# Patient Record
Sex: Female | Born: 1995 | Race: White | Hispanic: No | Marital: Single | Smoking: Never smoker
Health system: Southern US, Community
[De-identification: ages and names within clinical notes are randomized; demographics above are authoritative.]

## PROBLEM LIST (undated history)

## (undated) DIAGNOSIS — T7840XA Allergy, unspecified, initial encounter: Secondary | ICD-10-CM

## (undated) HISTORY — PX: FOOT SURGERY: SHX648

## (undated) HISTORY — DX: Allergy, unspecified, initial encounter: T78.40XA

## (undated) HISTORY — PX: WISDOM TOOTH EXTRACTION: SHX21

---
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
## 2015-06-14 ENCOUNTER — Emergency Department (INDEPENDENT_AMBULATORY_CARE_PROVIDER_SITE_OTHER)
Admission: EM | Admit: 2015-06-14 | Discharge: 2015-06-14 | Disposition: A | Discharge status: Home / Self Care | Payer: BLUE CROSS/BLUE SHIELD | Source: Home / Self Care | Attending: Emergency Medicine | Admitting: Emergency Medicine

## 2015-06-14 ENCOUNTER — Encounter (HOSPITAL_COMMUNITY): Payer: Self-pay | Admitting: Emergency Medicine

## 2015-06-14 DIAGNOSIS — J111 Influenza due to unidentified influenza virus with other respiratory manifestations: Secondary | ICD-10-CM

## 2015-06-14 NOTE — Discharge Instructions (Signed)
Influenza, Adult Influenza (flu) is an infection in the mouth, nose, and throat (respiratory tract) caused by a virus. The flu can make you feel very ill. Influenza spreads easily from person to person (contagious).  HOME CARE   Only take medicines as told by your doctor.  Use a cool mist humidifier to make breathing easier.  Get plenty of rest until your fever goes away. This usually takes 3 to 4 days.  Drink enough fluids to keep your pee (urine) clear or pale yellow.  Cover your mouth and nose when you cough or sneeze.  Wash your hands well to avoid spreading the flu.  Stay home from work or school until your fever has been gone for at least 1 full day.  Get a flu shot every year. GET HELP RIGHT AWAY IF:   You have trouble breathing or feel short of breath.  Your skin or nails turn blue.  You have severe neck pain or stiffness.  You have a severe headache, facial pain, or earache.  Your fever gets worse or keeps coming back.  You feel sick to your stomach (nauseous), throw up (vomit), or have watery poop (diarrhea).  You have chest pain.  You have a deep cough that gets worse, or you cough up more thick spit (mucus). MAKE SURE YOU:   Understand these instructions.  Will watch your condition.  Will get help right away if you are not doing well or get worse.   This information is not intended to replace advice given to you by your health care provider. Make sure you discuss any questions you have with your health care provider.   Document Released: 02/09/2008 Document Revised: 05/23/2014 Document Reviewed: 08/01/2011 Elsevier Interactive Patient Education 2016 [employer]  

## 2015-06-14 NOTE — ED Notes (Signed)
C/o cold sx onset x7 days associated w/fevers, BA, chills, HA, runny nose, congestion, prod cough Reports she saw PCP on Thursday... Was Rx Bactrim and Ventolin w/no relief A&O x4... No acute distress.

## 2015-06-14 NOTE — ED Provider Notes (Signed)
CSN: [id number]     Arrival date & time 06/14/15  1314 History   First MD Initiated Contact with Patient 06/14/15 1447     Chief Complaint  Patient presents with  . URI   (Consider location/radiation/quality/duration/timing/severity/associated sxs/prior Treatment) HPI URI symptoms for the past several days.  Thinks she may have the flu. Body aches, fever, congestion. Others at work have been ill. 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 Past Medical History  Diagnosis Date  . Allergy    History reviewed. No pertinent past surgical history. Family History  Problem Relation Age of Onset  . Hypertension Father   . Diabetes Sister    Social History  Substance Use Topics  . Smoking status: Never Smoker   . Smokeless tobacco: None  . Alcohol Use: No   OB History    No data available     Review of Systems ROS +'ve body aches, fever, sore throat  Denies: HEADACHE, NAUSEA, ABDOMINAL PAIN, CHEST PAIN, CONGESTION, DYSURIA, SHORTNESS OF BREATH  Allergies  Review of patient's allergies indicates no known allergies.  Home Medications   Prior to Admission medications   Medication Sig Start Date End Date Taking? Authorizing Provider  albuterol (PROVENTIL HFA;VENTOLIN HFA) 108 (90 Base) MCG/ACT inhaler Inhale into the lungs every 6 (six) hours as needed for wheezing or shortness of breath.   Yes Historical Provider, MD  sulfamethoxazole-trimethoprim (BACTRIM DS,SEPTRA DS) 800-160 MG tablet Take 1 tablet by mouth 2 (two) times daily.   Yes Historical Provider, MD  benzonatate (TESSALON) 100 MG capsule Take 2 capsules (200 mg total) by mouth 2 (two) times daily as needed for cough. 09/15/12   Thao P Le, DO  cetirizine (ZYRTEC) 10 MG tablet Take 10 mg by mouth daily.    Historical Provider, MD  fluticasone (FLONASE) 50 MCG/ACT nasal spray Place 2 sprays into the nose daily. 09/15/12   Thao P Le, DO   Meds Ordered and Administered this Visit  Medications - No data to display  BP 110/74  mmHg  Pulse 83  Temp(Src) 98.2 F (36.8 C) (Oral)  SpO2 98%  LMP 06/04/2015 No data found.   Physical Exam  Constitutional: She is oriented to person, place, and time. She appears well-developed and well-nourished.  HENT:  Head: Normocephalic and atraumatic.  Right Ear: External ear normal.  Left Ear: External ear normal.  Nose: Nose normal.  Mouth/Throat: No oropharyngeal exudate.  Eyes: Conjunctivae are normal.  Neck: Normal range of motion. Neck supple.  Pulmonary/Chest: Effort normal and breath sounds normal.  Abdominal: Soft.  Musculoskeletal: Normal range of motion.  Neurological: She is alert and oriented to person, place, and time.  Skin: Skin is warm and dry.  Psychiatric: She has a normal mood and affect. Her behavior is normal. Judgment and thought content normal.  Nursing note and vitals reviewed.   ED Course  Procedures (including critical care time)  Labs Review Labs Reviewed - No data to display  Imaging Review No results found.   Visual Acuity Review  Right Eye Distance:   Left Eye Distance:   Bilateral Distance:    Right Eye Near:   Left Eye Near:    Bilateral Near:         MDM   1. Flu syndrome    Continue symptomatic care at home. Diet and activity as tolerated. Return to work note provided. Instructions of care provided discharged home in stable condition.    Tharon Aquas, PA 06/14/15 1718

## 2015-09-14 DIAGNOSIS — M21611 Bunion of right foot: Secondary | ICD-10-CM | POA: Diagnosis not present

## 2015-09-14 DIAGNOSIS — Z4789 Encounter for other orthopedic aftercare: Secondary | ICD-10-CM | POA: Diagnosis not present

## 2015-12-03 DIAGNOSIS — J029 Acute pharyngitis, unspecified: Secondary | ICD-10-CM | POA: Diagnosis not present

## 2016-03-25 DIAGNOSIS — Z6829 Body mass index (BMI) 29.0-29.9, adult: Secondary | ICD-10-CM | POA: Diagnosis not present

## 2016-03-25 DIAGNOSIS — Z01419 Encounter for gynecological examination (general) (routine) without abnormal findings: Secondary | ICD-10-CM | POA: Diagnosis not present

## 2016-03-25 DIAGNOSIS — Z130 Encounter for screening for diseases of the blood and blood-forming organs and certain disorders involving the immune mechanism: Secondary | ICD-10-CM | POA: Diagnosis not present

## 2016-03-25 DIAGNOSIS — Z1389 Encounter for screening for other disorder: Secondary | ICD-10-CM | POA: Diagnosis not present

## 2016-04-12 DIAGNOSIS — S335XXA Sprain of ligaments of lumbar spine, initial encounter: Secondary | ICD-10-CM | POA: Diagnosis not present

## 2016-04-12 DIAGNOSIS — S161XXA Strain of muscle, fascia and tendon at neck level, initial encounter: Secondary | ICD-10-CM | POA: Diagnosis not present

## 2016-04-12 DIAGNOSIS — S29012A Strain of muscle and tendon of back wall of thorax, initial encounter: Secondary | ICD-10-CM | POA: Diagnosis not present

## 2016-04-12 DIAGNOSIS — S233XXA Sprain of ligaments of thoracic spine, initial encounter: Secondary | ICD-10-CM | POA: Diagnosis not present

## 2016-04-21 DIAGNOSIS — M5441 Lumbago with sciatica, right side: Secondary | ICD-10-CM | POA: Diagnosis not present

## 2016-05-02 DIAGNOSIS — S233XXA Sprain of ligaments of thoracic spine, initial encounter: Secondary | ICD-10-CM | POA: Diagnosis not present

## 2016-05-02 DIAGNOSIS — S161XXA Strain of muscle, fascia and tendon at neck level, initial encounter: Secondary | ICD-10-CM | POA: Diagnosis not present

## 2016-05-02 DIAGNOSIS — S29012A Strain of muscle and tendon of back wall of thorax, initial encounter: Secondary | ICD-10-CM | POA: Diagnosis not present

## 2016-05-02 DIAGNOSIS — M5441 Lumbago with sciatica, right side: Secondary | ICD-10-CM | POA: Diagnosis not present

## 2016-05-13 DIAGNOSIS — M5441 Lumbago with sciatica, right side: Secondary | ICD-10-CM | POA: Diagnosis not present

## 2016-05-17 DIAGNOSIS — M5441 Lumbago with sciatica, right side: Secondary | ICD-10-CM | POA: Diagnosis not present

## 2016-05-26 DIAGNOSIS — M5441 Lumbago with sciatica, right side: Secondary | ICD-10-CM | POA: Diagnosis not present

## 2016-05-31 DIAGNOSIS — M5441 Lumbago with sciatica, right side: Secondary | ICD-10-CM | POA: Diagnosis not present

## 2016-06-07 DIAGNOSIS — S335XXD Sprain of ligaments of lumbar spine, subsequent encounter: Secondary | ICD-10-CM | POA: Diagnosis not present

## 2016-06-07 DIAGNOSIS — M5441 Lumbago with sciatica, right side: Secondary | ICD-10-CM | POA: Diagnosis not present

## 2016-08-31 DIAGNOSIS — J029 Acute pharyngitis, unspecified: Secondary | ICD-10-CM | POA: Diagnosis not present

## 2017-02-22 DIAGNOSIS — M79643 Pain in unspecified hand: Secondary | ICD-10-CM | POA: Diagnosis not present

## 2017-02-22 DIAGNOSIS — R635 Abnormal weight gain: Secondary | ICD-10-CM | POA: Diagnosis not present

## 2017-02-22 LAB — BASIC METABOLIC PANEL
BUN: 10 (ref 4–21)
CREATININE: 0.7 (ref <=1.1)
GLUCOSE: 81
POTASSIUM: 3.9 (ref 3.4–5.3)
SODIUM: 139 (ref 137–147)

## 2017-02-22 LAB — HEPATIC FUNCTION PANEL
ALT: 15 (ref 7–35)
AST: 15 (ref 13–35)
Alkaline Phosphatase: 69 (ref 25–125)
Bilirubin, Total: 0.2

## 2017-02-22 LAB — TSH: TSH: 3.16 (ref <=5.90)

## 2017-05-01 NOTE — Progress Notes (Signed)
Subjective:    Patient ID: Kelsey Ayers, female    DOB: 10-02-1995, 21 y.o.   MRN: [id number]  HPI:  Kelsey Ayers is here to establish as a new pt. She is a pleasant 21 year old female.  PMH: Chronic seasonal allergies.  She has had productive cough (thick/yellow mucus) for the last 3 weeks that initially improved then worsened about 5-6 days ago.  She has hx of seasonal allergies and she has been using OTC Zyrtec and Claritin with only minimal sx relief. She denies fever/night sweats/N/V/D.  She has had some clear nasal on/off the last month as well. She reports drinking 40-50 pz water/day and denies regular exercise and eats "whatever is easiest". She denies tobacco/excessive ETOH use.  Patient Care Team    Relationship Specialty Notifications Start End  Julaine Fusianford, Katy D, NP PCP - General Family Medicine  05/04/17   [facility]   Obstetrics and Gynecology  05/04/17   Alena BillsLittle, Edgar, MD Consulting Physician Pediatrics  05/04/17   Milus Heightedmon, Noelle, PA-C Physician Assistant Nurse Practitioner  05/04/17 05/04/17    Patient Active Problem List   Diagnosis Date Noted  . Healthcare maintenance 05/04/2017  . Seasonal allergies 05/04/2017  . Cough 05/04/2017     Past Medical History:  Diagnosis Date  . Allergy      Past Surgical History:  Procedure Laterality Date  . FOOT SURGERY Right   . WISDOM TOOTH EXTRACTION       Family History  Problem Relation Age of Onset  . Hypertension Father   . Diabetes Sister      Social History   Substance and Sexual Activity  Drug Use No     Social History   Substance and Sexual Activity  Alcohol Use Yes  . Alcohol/week: 0.6 oz  . Types: 1 Glasses of wine per week     Social History   Tobacco Use  Smoking Status Never Smoker  Smokeless Tobacco Never Used     Outpatient Encounter Medications as of 05/04/2017  Medication Sig  . azithromycin (ZITHROMAX) 250 MG tablet 2 tabs by mouth day one, 1 tab by mouth days 2-  5  . chlorpheniramine-HYDROcodone (TUSSIONEX PENNKINETIC ER) 10-8 MG/5ML SUER Take 5 mLs by mouth at bedtime as needed for cough.  . montelukast (SINGULAIR) 10 MG tablet Take 1 tablet (10 mg total) by mouth at bedtime.  . [DISCONTINUED] albuterol (PROVENTIL HFA;VENTOLIN HFA) 108 (90 Base) MCG/ACT inhaler Inhale into the lungs every 6 (six) hours as needed for wheezing or shortness of breath.  . [DISCONTINUED] benzonatate (TESSALON) 100 MG capsule Take 2 capsules (200 mg total) by mouth 2 (two) times daily as needed for cough.  . [DISCONTINUED] cetirizine (ZYRTEC) 10 MG tablet Take 10 mg by mouth daily.  . [DISCONTINUED] fluticasone (FLONASE) 50 MCG/ACT nasal spray Place 2 sprays into the nose daily.  . [DISCONTINUED] sulfamethoxazole-trimethoprim (BACTRIM DS,SEPTRA DS) 800-160 MG tablet Take 1 tablet by mouth 2 (two) times daily.   No facility-administered encounter medications on file as of 05/04/2017.     Allergies: Other  Body mass index is 33.88 kg/m.  Blood pressure 118/76, pulse 80, height 5' 4.5" (1.638 m), weight 200 lb 8 oz (90.9 kg), last menstrual period 04/24/2017, SpO2 97 %.     Review of Systems  Constitutional: Positive for fatigue. Negative for activity change, appetite change, chills, diaphoresis, fever and unexpected weight change.  HENT: Positive for congestion and sinus pressure. Negative for sore throat.   Eyes: Negative for  visual disturbance.  Respiratory: Positive for cough. Negative for chest tightness, shortness of breath, wheezing and stridor.   Cardiovascular: Negative for chest pain and leg swelling.  Gastrointestinal: Negative for abdominal distention, abdominal pain, blood in stool, constipation, diarrhea, nausea and vomiting.  Endocrine: Negative for cold intolerance, heat intolerance, polydipsia, polyphagia and polyuria.  Genitourinary: Negative for difficulty urinating and flank pain.  Musculoskeletal: Negative for arthralgias, back pain, gait  problem, joint swelling, myalgias, neck pain and neck stiffness.  Skin: Negative for color change, pallor, rash and wound.  Neurological: Negative for dizziness and headaches.  Hematological: Bruises/bleeds easily.  Psychiatric/Behavioral: Positive for sleep disturbance. Negative for self-injury and suicidal ideas. The patient is not nervous/anxious.        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 No distress.  HENT:  Head: Normocephalic and atraumatic.  Right Ear: Hearing, tympanic membrane, external ear and ear canal normal. Tympanic membrane is not erythematous and not bulging. No decreased hearing is noted.  Left Ear: Hearing, tympanic membrane, external ear and ear canal normal. Tympanic membrane is not erythematous and not bulging. No decreased hearing is noted.  Eyes: Conjunctivae are normal. Pupils are equal, round, and reactive to light.  Neck: Normal range of motion. Neck supple.  Cardiovascular: Normal rate, regular rhythm, normal heart sounds and intact distal pulses.  No murmur heard. Pulmonary/Chest: Effort normal and breath sounds normal. No respiratory distress. She has no decreased breath sounds. She has no wheezes. She has no rhonchi. She has no rales. She exhibits no tenderness.  Constant cough noted during OV  Abdominal: Soft. Bowel sounds are normal. She exhibits no distension and no mass. There is no tenderness. There is no rebound and no guarding.  Lymphadenopathy:    She has no cervical adenopathy.  Skin: Skin is warm and dry. No rash noted. She is not diaphoretic. No erythema. No pallor.  Psychiatric: She has a normal mood and affect. Her behavior is normal. Judgment and thought content normal.  Nursing note and vitals reviewed.         Assessment & Plan:   1. Healthcare maintenance   2. Seasonal allergies   3. Cough     Seasonal allergies Increase water intake, strive for  at leas 100 ounces/day.   Follow Heart Healthy diet. Please stop OTC antihistamine and start once daily Singulair.   Healthcare maintenance Increase water intake, strive for at leas 100 ounces/day.   Follow Heart Healthy diet Increase regular exercise.  Recommend at least 30 minutes daily, 5 days per week of walking, jogging, biking, swimming, YouTube/Pinterest workout videos. Annual physical with fasting labs.  Cough [place] Controlled Substance Database reviewed-no contraindications noted. Please stop OTC antihistamine and start once daily Singulair. Please use Azithromycin an Tussionex as directed. If you still have symptoms after antibiotic completed, then please call clinic.    FOLLOW-UP:  Return in about 1 year (around 05/04/2018) for CPE, Fasting Labs.

## 2017-05-04 ENCOUNTER — Ambulatory Visit (INDEPENDENT_AMBULATORY_CARE_PROVIDER_SITE_OTHER): Payer: BLUE CROSS/BLUE SHIELD | Admitting: Adult Health

## 2017-05-04 ENCOUNTER — Other Ambulatory Visit: Payer: Self-pay | Admitting: Adult Health

## 2017-05-04 ENCOUNTER — Encounter: Payer: Self-pay | Admitting: Adult Health

## 2017-05-04 VITALS — BP 118/76 | HR 80 | Ht 64.5 in | Wt 200.5 lb

## 2017-05-04 DIAGNOSIS — J302 Other seasonal allergic rhinitis: Secondary | ICD-10-CM

## 2017-05-04 DIAGNOSIS — R05 Cough: Secondary | ICD-10-CM

## 2017-05-04 DIAGNOSIS — Z0000 Encounter for general adult medical examination without abnormal findings: Secondary | ICD-10-CM

## 2017-05-04 DIAGNOSIS — R059 Cough, unspecified: Secondary | ICD-10-CM

## 2017-05-04 MED ORDER — HYDROCOD POLST-CPM POLST ER 10-8 MG/5ML PO SUER: 5.0000 mL | Freq: Every evening | ORAL | 0 refills | Status: DC | PRN

## 2017-05-04 MED ORDER — MONTELUKAST SODIUM 10 MG PO TABS: 10.0000 mg | ORAL_TABLET | Freq: Every day | ORAL | 3 refills | Status: DC

## 2017-05-04 MED ORDER — AZITHROMYCIN 250 MG PO TABS: ORAL_TABLET | ORAL | 0 refills | Status: DC

## 2017-05-04 NOTE — Patient Instructions (Addendum)
Heart-Healthy Eating Plan Many factors influence your heart health, including eating and exercise habits. Heart (coronary) risk increases with abnormal blood fat (lipid) levels. Heart-healthy meal planning includes limiting unhealthy fats, increasing healthy fats, and making other small dietary changes. This includes maintaining a healthy body weight to help keep lipid levels within a normal range. What is my plan? Your health care provider recommends that you:  Get no more than ___25__% of the total calories in your daily diet from fat.  Limit your intake of saturated fat to less than ___5___% of your total calories each day.  Limit the amount of cholesterol in your diet to less than __300___ mg per day.  What types of fat should I choose?  Choose healthy fats more often. Choose monounsaturated and polyunsaturated fats, such as olive oil and canola oil, flaxseeds, walnuts, almonds, and seeds.  Eat more omega-3 fats. Good choices include salmon, mackerel, sardines, tuna, flaxseed oil, and ground flaxseeds. Aim to eat fish at least two times each week.  Limit saturated fats. Saturated fats are primarily found in animal products, such as meats, butter, and cream. Plant sources of saturated fats include palm oil, palm kernel oil, and coconut oil.  Avoid foods with partially hydrogenated oils in them. These contain trans fats. Examples of foods that contain trans fats are stick margarine, some tub margarines, cookies, crackers, and other baked goods. What general guidelines do I need to follow?  Check food labels carefully to identify foods with trans fats or high amounts of saturated fat.  Fill one half of your plate with vegetables and green salads. Eat 4-5 servings of vegetables per day. A serving of vegetables equals 1 cup of raw leafy vegetables,  cup of raw or cooked cut-up vegetables, or  cup of vegetable juice.  Fill one fourth of your plate with whole grains. Look for the word  "whole" as the first word in the ingredient list.  Fill one fourth of your plate with lean protein foods.  Eat 4-5 servings of fruit per day. A serving of fruit equals one medium whole fruit,  cup of dried fruit,  cup of fresh, frozen, or canned fruit, or  cup of 100% fruit juice.  Eat more foods that contain soluble fiber. Examples of foods that contain this type of fiber are apples, broccoli, carrots, beans, peas, and barley. Aim to get 20-30 g of fiber per day.  Eat more home-cooked food and less restaurant, buffet, and fast food.  Limit or avoid alcohol.  Limit foods that are high in starch and sugar.  Avoid fried foods.  Cook foods by using methods other than frying. Baking, boiling, grilling, and broiling are all great options. Other fat-reducing suggestions include: ? Removing the skin from poultry. ? Removing all visible fats from meats. ? Skimming the fat off of stews, soups, and gravies before serving them. ? Steaming vegetables in water or broth.  Lose weight if you are overweight. Losing just 5-10% of your initial body weight can help your overall health and prevent diseases such as diabetes and heart disease.  Increase your consumption of nuts, legumes, and seeds to 4-5 servings per week. One serving of dried beans or legumes equals  cup after being cooked, one serving of nuts equals 1 ounces, and one serving of seeds equals  ounce or 1 tablespoon.  You may need to monitor your salt (sodium) intake, especially if you have high blood pressure. Talk with your health care provider or dietitian to get  more information about reducing sodium. What foods can I eat? Grains  Breads, including French, white, pita, wheat, raisin, rye, oatmeal, and Italian. Tortillas that are neither fried nor made with lard or trans fat. Low-fat rolls, including hotdog and hamburger buns and English muffins. Biscuits. Muffins. Waffles. Pancakes. Light popcorn. Whole-grain cereals. Flatbread.  Melba toast. Pretzels. Breadsticks. Rusks. Low-fat snacks and crackers, including oyster, saltine, matzo, graham, animal, and rye. Rice and pasta, including brown rice and those that are made with whole wheat. Vegetables All vegetables. Fruits All fruits, but limit coconut. Meats and Other Protein Sources Lean, well-trimmed beef, veal, pork, and lamb. Chicken and turkey without skin. All fish and shellfish. Wild duck, rabbit, pheasant, and venison. Egg whites or low-cholesterol egg substitutes. Dried beans, peas, lentils, and tofu.Seeds and most nuts. Dairy Low-fat or nonfat cheeses, including ricotta, string, and mozzarella. Skim or 1% milk that is liquid, powdered, or evaporated. Buttermilk that is made with low-fat milk. Nonfat or low-fat yogurt. Beverages Mineral water. Diet carbonated beverages. Sweets and Desserts Sherbets and fruit ices. Honey, jam, marmalade, jelly, and syrups. Meringues and gelatins. Pure sugar candy, such as hard candy, jelly beans, gumdrops, mints, marshmallows, and small amounts of dark chocolate. Angel food cake. Eat all sweets and desserts in moderation. Fats and Oils Nonhydrogenated (trans-free) margarines. Vegetable oils, including soybean, sesame, sunflower, olive, peanut, safflower, corn, canola, and cottonseed. Salad dressings or mayonnaise that are made with a vegetable oil. Limit added fats and oils that you use for cooking, baking, salads, and as spreads. Other Cocoa powder. Coffee and tea. All seasonings and condiments. The items listed above may not be a complete list of recommended foods or beverages. Contact your dietitian for more options. What foods are not recommended? Grains Breads that are made with saturated or trans fats, oils, or whole milk. Croissants. Butter rolls. Cheese breads. Sweet rolls. Donuts. Buttered popcorn. Chow mein noodles. High-fat crackers, such as cheese or butter crackers. Meats and Other Protein Sources Fatty meats, such  as hotdogs, short ribs, sausage, spareribs, bacon, ribeye roast or steak, and mutton. High-fat deli meats, such as salami and bologna. Caviar. Domestic duck and goose. Organ meats, such as kidney, liver, sweetbreads, brains, gizzard, chitterlings, and heart. Dairy Cream, sour cream, cream cheese, and creamed cottage cheese. Whole milk cheeses, including blue (bleu), Monterey Jack, Brie, [place], American, Havarti, Swiss, cheddar, Camembert, and [place]. Whole or 2% milk that is liquid, evaporated, or condensed. Whole buttermilk. Cream sauce or high-fat cheese sauce. Yogurt that is made from whole milk. Beverages Regular sodas and drinks with added sugar. Sweets and Desserts Frosting. Pudding. Cookies. Cakes other than angel food cake. Candy that has milk chocolate or white chocolate, hydrogenated fat, butter, coconut, or unknown ingredients. Buttered syrups. Full-fat ice cream or ice cream drinks. Fats and Oils Gravy that has suet, meat fat, or shortening. Cocoa butter, hydrogenated oils, palm oil, coconut oil, palm kernel oil. These can often be found in baked products, candy, fried foods, nondairy creamers, and whipped toppings. Solid fats and shortenings, including bacon fat, salt pork, lard, and butter. Nondairy cream substitutes, such as coffee creamers and sour cream substitutes. Salad dressings that are made of unknown oils, cheese, or sour cream. The items listed above may not be a complete list of foods and beverages to avoid. Contact your dietitian for more information. This information is not intended to replace advice given to you by your health care provider. Make sure you discuss any questions you have with your health care   provider. Document Released: 02/09/2008 Document Revised: 11/20/2015 Document Reviewed: 10/24/2013 Elsevier Interactive Patient Education  2018 [employer]   Cough, Adult Coughing is a reflex that clears your throat and your airways. Coughing helps to heal and  protect your lungs. It is normal to cough occasionally, but a cough that happens with other symptoms or lasts a long time may be a sign of a condition that needs treatment. A cough may last only 2-3 weeks (acute), or it may last longer than 8 weeks (chronic). What are the causes? Coughing is commonly caused by:  Breathing in substances that irritate your lungs.  A viral or bacterial respiratory infection.  Allergies.  Asthma.  Postnasal drip.  Smoking.  Acid backing up from the stomach into the esophagus (gastroesophageal reflux).  Certain medicines.  Chronic lung problems, including COPD (or rarely, lung cancer).  Other medical conditions such as heart failure.  Follow these instructions at home: Pay attention to any changes in your symptoms. Take these actions to help with your discomfort:  Take medicines only as told by your health care provider. ? If you were prescribed an antibiotic medicine, take it as told by your health care provider. Do not stop taking the antibiotic even if you start to feel better. ? Talk with your health care provider before you take a cough suppressant medicine.  Drink enough fluid to keep your urine clear or pale yellow.  If the air is dry, use a cold steam vaporizer or humidifier in your bedroom or your home to help loosen secretions.  Avoid anything that causes you to cough at work or at home.  If your cough is worse at night, try sleeping in a semi-upright position.  Avoid cigarette smoke. If you smoke, quit smoking. If you need help quitting, ask your health care provider.  Avoid caffeine.  Avoid alcohol.  Rest as needed.  Contact a health care provider if:  You have new symptoms.  You cough up pus.  Your cough does not get better after 2-3 weeks, or your cough gets worse.  You cannot control your cough with suppressant medicines and you are losing sleep.  You develop pain that is getting worse or pain that is not controlled  with pain medicines.  You have a fever.  You have unexplained weight loss.  You have night sweats. Get help right away if:  You cough up blood.  You have difficulty breathing.  Your heartbeat is very fast. This information is not intended to replace advice given to you by your health care provider. Make sure you discuss any questions you have with your health care provider. Document Released: 10/29/2010 Document Revised: 10/08/2015 Document Reviewed: 07/09/2014 Elsevier Interactive Patient Education  2018 [employer]  Increase water intake, strive for at leas 100 ounces/day.   Follow Heart Healthy diet Increase regular exercise.  Recommend at least 30 minutes daily, 5 days per week of walking, jogging, biking, swimming, YouTube/Pinterest workout videos. Please stop OTC antihistamine and start once daily Singulair. Please use Azithromycin an Tussionex as directed. If you still have symptoms after antibiotic completed, then please call clinic. Annual physical with fasting labs. WELCOME TO THE PRACTICE!

## 2017-05-04 NOTE — Assessment & Plan Note (Signed)
Increase water intake, strive for at leas 100 ounces/day.   Follow Heart Healthy diet Increase regular exercise.  Recommend at least 30 minutes daily, 5 days per week of walking, jogging, biking, swimming, YouTube/Pinterest workout videos. Annual physical with fasting labs.

## 2017-05-04 NOTE — Assessment & Plan Note (Signed)
[place] Controlled Substance Database reviewed-no contraindications noted. Please stop OTC antihistamine and start once daily Singulair. Please use Azithromycin an Tussionex as directed. If you still have symptoms after antibiotic completed, then please call clinic.

## 2017-05-04 NOTE — Assessment & Plan Note (Signed)
Increase water intake, strive for at leas 100 ounces/day.   Follow Heart Healthy diet. Please stop OTC antihistamine and start once daily Singulair.

## 2017-06-13 ENCOUNTER — Ambulatory Visit (INDEPENDENT_AMBULATORY_CARE_PROVIDER_SITE_OTHER): Payer: BLUE CROSS/BLUE SHIELD | Admitting: Adult Health

## 2017-06-13 ENCOUNTER — Encounter: Payer: Self-pay | Admitting: Adult Health

## 2017-06-13 VITALS — BP 107/70 | HR 91 | Temp 98.9°F | Ht 64.5 in | Wt 200.6 lb

## 2017-06-13 DIAGNOSIS — J029 Acute pharyngitis, unspecified: Secondary | ICD-10-CM | POA: Diagnosis not present

## 2017-06-13 DIAGNOSIS — J0100 Acute maxillary sinusitis, unspecified: Secondary | ICD-10-CM

## 2017-06-13 LAB — POCT RAPID STREP A (OFFICE): RAPID STREP A SCREEN: NEGATIVE

## 2017-06-13 MED ORDER — AMOXICILLIN-POT CLAVULANATE 875-125 MG PO TABS: 1.0000 | ORAL_TABLET | Freq: Two times a day (BID) | ORAL | 0 refills | Status: DC

## 2017-06-13 MED ORDER — PREDNISONE 20 MG PO TABS: ORAL_TABLET | ORAL | 0 refills | Status: DC

## 2017-06-13 NOTE — Patient Instructions (Signed)
Otitis Media, Adult Otitis media occurs when there is inflammation and fluid in the middle ear. Your middle ear is a part of the ear that contains bones for hearing as well as air that helps send sounds to your brain. What are the causes? This condition is caused by a blockage in the eustachian tube. This tube drains fluid from the ear to the back of the nose (nasopharynx). A blockage in this tube can be caused by an object or by swelling (edema) in the tube. Problems that can cause a blockage include:  A cold or other upper respiratory infection.  Allergies.  An irritant, such as tobacco smoke.  Enlarged adenoids. The adenoids are areas of soft tissue located high in the back of the throat, behind the nose and the roof of the mouth.  A mass in the nasopharynx.  Damage to the ear caused by pressure changes (barotrauma). What are the signs or symptoms? Symptoms of this condition include:  Ear pain.  A fever.  Decreased hearing.  A headache.  Tiredness (lethargy).  Fluid leaking from the ear.  Ringing in the ear. How is this diagnosed? This condition is diagnosed with a physical exam. During the exam your health care provider will use an instrument called an otoscope to look into your ear and check for redness, swelling, and fluid. He or she will also ask about your symptoms. Your health care provider may also order tests, such as:  A test to check the movement of the eardrum (pneumatic otoscopy). This test is done by squeezing a small amount of air into the ear.  A test that changes air pressure in the middle ear to check how well the eardrum moves and whether the eustachian tube is working (tympanogram). How is this treated? This condition usually goes away on its own within 3-5 days. But if the condition is caused by a bacteria infection and does not go away own its own, or keeps coming back, your health care provider may:  Prescribe antibiotic medicines to treat the  infection.  Prescribe or recommend medicines to control pain. Follow these instructions at home:  Take over-the-counter and prescription medicines only as told by your health care provider.  If you were prescribed an antibiotic medicine, take it as told by your health care provider. Do not stop taking the antibiotic even if you start to feel better.  Keep all follow-up visits as told by your health care provider. This is important. Contact a health care provider if:  You have bleeding from your nose.  There is a lump on your neck.  You are not getting better in 5 days.  You feel worse instead of better. Get help right away if:  You have severe pain that is not controlled with medicine.  You have swelling, redness, or pain around your ear.  You have stiffness in your neck.  A part of your face is paralyzed.  The bone behind your ear (mastoid) is tender when you touch it.  You develop a severe headache. Summary  Otitis media is redness, soreness, and swelling of the middle ear.  This condition usually goes away on its own within 3-5 days.  If the problem does not go away in 3-5 days, your health care provider may prescribe or recommend medicines to treat your symptoms.  If you were prescribed an antibiotic medicine, take it as told by your health care provider. This information is not intended to replace advice given to you by your   to you by your health care provider. Make sure you discuss any questions you have with your health care provider. Document Released: 02/05/2004 Document Revised: 04/22/2016 Document Reviewed: 04/22/2016 Elsevier Interactive Patient Education  2018 [employer]   Pharyngitis Pharyngitis is redness, pain, and swelling (inflammation) of the throat (pharynx). It is a very common cause of sore throat. Pharyngitis can be caused by a bacteria, but it is usually caused by a virus. Most cases of pharyngitis get better on their own without treatment. What are the  causes? This condition may be caused by:  Infection by viruses (viral). Viral pharyngitis spreads from person to person (is contagious) through coughing, sneezing, and sharing of personal items or utensils such as cups, forks, spoons, and toothbrushes.  Infection by bacteria (bacterial). Bacterial pharyngitis may be spread by touching the nose or face after coming in contact with the bacteria, or through more intimate contact, such as kissing.  Allergies. Allergies can cause buildup of mucus in the throat (post-nasal drip), leading to inflammation and irritation. Allergies can also cause blocked nasal passages, forcing breathing through the mouth, which dries and irritates the throat.  What increases the risk? You are more likely to develop this condition if:  You are 115-24 years old.  You are exposed to crowded environments such as daycare, school, or dormitory living.  You live in a cold climate.  You have a weakened disease-fighting (immune) system.  What are the signs or symptoms? Symptoms of this condition vary by the cause (viral, bacterial, or allergies) and can include:  Sore throat.  Fatigue.  Low-grade fever.  Headache.  Joint pain and muscle aches.  Skin rashes.  Swollen glands in the throat (lymph nodes).  Plaque-like film on the throat or tonsils. This is often a symptom of bacterial pharyngitis.  Vomiting.  Stuffy nose (nasal congestion).  Cough.  Red, itchy eyes (conjunctivitis).  Loss of appetite.  How is this diagnosed? This condition is often diagnosed based on your medical history and a physical exam. Your health care provider will ask you questions about your illness and your symptoms. A swab of your throat may be done to check for bacteria (rapid strep test). Other lab tests may also be done, depending on the suspected cause, but these are rare. How is this treated? This condition usually gets better in 3-4 days without medicine. Bacterial  pharyngitis may be treated with antibiotic medicines. Follow these instructions at home:  Take over-the-counter and prescription medicines only as told by your health care provider. ? If you were prescribed an antibiotic medicine, take it as told by your health care provider. Do not stop taking the antibiotic even if you start to feel better. ? Do not give children aspirin because of the association with Reye syndrome.  Drink enough water and fluids to keep your urine clear or pale yellow.  Get a lot of rest.  Gargle with a salt-water mixture 3-4 times a day or as needed. To make a salt-water mixture, completely dissolve -1 tsp of salt in 1 cup of warm water.  If your health care provider approves, you may use throat lozenges or sprays to soothe your throat. Contact a health care provider if:  You have large, tender lumps in your neck.  You have a rash.  You cough up green, yellow-brown, or bloody spit. Get help right away if:  Your neck becomes stiff.  You drool or are unable to swallow liquids.  You cannot drink or take medicines without  vomiting.  You have severe pain that does not go away, even after you take medicine.  You have trouble breathing, and it is not caused by a stuffy nose.  You have new pain and swelling in your joints such as the knees, ankles, wrists, or elbows. Summary  Pharyngitis is redness, pain, and swelling (inflammation) of the throat (pharynx).  While pharyngitis can be caused by a bacteria, the most common causes are viral.  Most cases of pharyngitis get better on their own without treatment.  Bacterial pharyngitis is treated with antibiotic medicines. This information is not intended to replace advice given to you by your health care provider. Make sure you discuss any questions you have with your health care provider. Document Released: 05/02/2005 Document Revised: 06/07/2016 Document Reviewed: 06/07/2016 Elsevier Interactive Patient  Education  2018 [employer]   Increase fluids/rest/vit-c: 2,000mg /daily Prednisone taper and Augmentin as directed. Alternate OTC Acetaminophen and Ibuprofen as directed. Work excuse provided, okay to return 06/15/17 If symptoms persist after antibiotic completed, then please call clinic.

## 2017-06-13 NOTE — Progress Notes (Signed)
Subjective:    Patient ID: Kelsey Ayers, female    DOB: [birth year], 22 y.o.   MRN: [id number]  HPI:  Kelsey Ayers presents with productive cough (thick, green mucus), clear nasal drainage, L ear fullness, sinus pressure, and extreme fatigue that all started >1.5 weeks ago and have steadily worsening.  She has been pushing fluids and using OTC DayQuil and Tylenol Flu with only minor sx relief.  She denies CP/dyspnea/dizziness/palpitations/fever/night sweats/N/V/D She denies tobacco use  Patient Care Team    Relationship Specialty Notifications Start End  Kelsey Fusi, NP PCP - General Family Medicine  05/04/17   Kelsey Bills, MD Consulting Physician Pediatrics  05/04/17     Patient Active Problem List   Diagnosis Date Noted  . Sore throat 06/13/2017  . Acute maxillary sinusitis 06/13/2017  . Healthcare maintenance 05/04/2017  . Seasonal allergies 05/04/2017  . Cough 05/04/2017     Past Medical History:  Diagnosis Date  . Allergy      Past Surgical History:  Procedure Laterality Date  . FOOT SURGERY Right   . WISDOM TOOTH EXTRACTION       Family History  Problem Relation Age of Onset  . Hypertension Father   . Diabetes Sister      Social History   Substance and Sexual Activity  Drug Use No     Social History   Substance and Sexual Activity  Alcohol Use Yes  . Alcohol/week: 0.6 oz  . Types: 1 Glasses of wine per week     Social History   Tobacco Use  Smoking Status Never Smoker  Smokeless Tobacco Never Used     Outpatient Encounter Medications as of 06/13/2017  Medication Sig  . montelukast (SINGULAIR) 10 MG tablet Take 1 tablet (10 mg total) by mouth at bedtime.  Marland Kitchen amoxicillin-clavulanate (AUGMENTIN) 875-125 MG tablet Take 1 tablet by mouth 2 (two) times daily.  . predniSONE (DELTASONE) 20 MG tablet 1 tab every 12 hrs for first three days.  1 tab daily for 3 days  . [DISCONTINUED] azithromycin (ZITHROMAX) 250 MG tablet 2 tabs by mouth day one,  1 tab by mouth days 2- 5  . [DISCONTINUED] chlorpheniramine-HYDROcodone (TUSSIONEX PENNKINETIC ER) 10-8 MG/5ML SUER Take 5 mLs by mouth at bedtime as needed for cough.   No facility-administered encounter medications on file as of 06/13/2017.     Allergies: Other  Body mass index is 33.9 kg/m.  Blood pressure 107/70, pulse 91, temperature 98.9 F (37.2 C), temperature source Oral, height 5' 4.5" (1.638 m), weight 200 lb 9.6 oz (91 kg), last menstrual period 05/07/2017, SpO2 98 %.     Review of Systems  Constitutional: Positive for activity change and fatigue. Negative for appetite change, chills, diaphoresis, fever and unexpected weight change.  HENT: Positive for congestion, ear pain, postnasal drip, rhinorrhea, sinus pressure, sinus pain and sore throat. Negative for sneezing, trouble swallowing and voice change.   Eyes: Negative for visual disturbance.  Respiratory: Positive for cough. Negative for chest tightness, shortness of breath, wheezing and stridor.   Cardiovascular: Negative for chest pain, palpitations and leg swelling.  Gastrointestinal: Negative for abdominal distention, abdominal pain, blood in stool, constipation, diarrhea, nausea and vomiting.  Endocrine: Negative for cold intolerance, heat intolerance, polydipsia, polyphagia and polyuria.  Genitourinary: Negative for difficulty urinating, flank pain and hematuria.  Neurological: Negative for dizziness and headaches.  Psychiatric/Behavioral: Positive for sleep disturbance. Negative for decreased concentration, dysphoric mood, hallucinations, self-injury and suicidal ideas. The patient is not nervous/anxious  and is not hyperactive.        Objective:   Physical Exam  Constitutional: She is oriented to person, place, and time. She appears well-developed and well-nourished. No distress.  HENT:  Head: Normocephalic and atraumatic.  Right Ear: Hearing, external ear and ear canal normal. Tympanic membrane is  erythematous and bulging. No decreased hearing is noted.  Left Ear: Tympanic membrane is erythematous and bulging. No decreased hearing is noted.  Nose: Mucosal edema and rhinorrhea present. Right sinus exhibits maxillary sinus tenderness. Right sinus exhibits no frontal sinus tenderness. Left sinus exhibits maxillary sinus tenderness. Left sinus exhibits no frontal sinus tenderness.  Mouth/Throat: Uvula is midline and mucous membranes are normal. Posterior oropharyngeal edema and posterior oropharyngeal erythema present. No oropharyngeal exudate or tonsillar abscesses.  Eyes: Conjunctivae are normal. Pupils are equal, round, and reactive to light.  Neck: Normal range of motion. Neck supple.  Cardiovascular: Normal rate, regular rhythm, normal heart sounds and intact distal pulses.  No murmur heard. Pulmonary/Chest: Effort normal and breath sounds normal. No respiratory distress. She has no wheezes. She has no rales.  Lymphadenopathy:    She has no cervical adenopathy.  Neurological: She is alert and oriented to person, place, and time.  Skin: Skin is warm and dry. No rash noted. She is not diaphoretic. No erythema. There is pallor.  He baseline is quite pale in complexion  Psychiatric: She has a normal mood and affect. Her behavior is normal. Judgment and thought content normal.  Nursing note and vitals reviewed.         Assessment & Plan:   1. Sore throat   2. Acute maxillary sinusitis, recurrence not specified     Acute maxillary sinusitis  Increase fluids/rest/vit-c: 2,000mg /daily Prednisone taper and Augmentin as directed. Alternate OTC Acetaminophen and Ibuprofen as directed. Work excuse provided, okay to return 06/15/17 If symptoms persist after antibiotic completed, then please call clinic.    Sore throat Strep Test Neg    FOLLOW-UP:  Return if symptoms worsen or fail to improve.

## 2017-06-13 NOTE — Assessment & Plan Note (Signed)
  Increase fluids/rest/vit-c: 2,000mg /daily Prednisone taper and Augmentin as directed. Alternate OTC Acetaminophen and Ibuprofen as directed. Work excuse provided, okay to return 06/15/17 If symptoms persist after antibiotic completed, then please call clinic.

## 2017-06-13 NOTE — Assessment & Plan Note (Signed)
Strep Test Neg

## 2017-06-25 ENCOUNTER — Encounter: Payer: Self-pay | Admitting: Adult Health

## 2017-06-26 ENCOUNTER — Other Ambulatory Visit: Payer: Self-pay | Admitting: Adult Health

## 2017-06-26 MED ORDER — DOXYCYCLINE HYCLATE 100 MG PO TABS: 100.0000 mg | ORAL_TABLET | Freq: Two times a day (BID) | ORAL | 0 refills | Status: DC

## 2017-09-14 ENCOUNTER — Encounter: Payer: Self-pay | Admitting: Family Medicine

## 2017-09-14 ENCOUNTER — Ambulatory Visit (INDEPENDENT_AMBULATORY_CARE_PROVIDER_SITE_OTHER): Payer: BLUE CROSS/BLUE SHIELD | Admitting: Family Medicine

## 2017-09-14 ENCOUNTER — Encounter: Payer: Self-pay | Admitting: Adult Health

## 2017-09-14 VITALS — BP 132/83 | HR 110 | Temp 101.5°F | Ht 64.5 in | Wt 201.8 lb

## 2017-09-14 DIAGNOSIS — R110 Nausea: Secondary | ICD-10-CM

## 2017-09-14 DIAGNOSIS — J029 Acute pharyngitis, unspecified: Secondary | ICD-10-CM | POA: Diagnosis not present

## 2017-09-14 DIAGNOSIS — J020 Streptococcal pharyngitis: Secondary | ICD-10-CM | POA: Insufficient documentation

## 2017-09-14 DIAGNOSIS — R509 Fever, unspecified: Secondary | ICD-10-CM

## 2017-09-14 LAB — POCT RAPID STREP A (OFFICE): Rapid Strep A Screen: NEGATIVE

## 2017-09-14 MED ORDER — AMOXICILLIN 875 MG PO TABS: 875.0000 mg | ORAL_TABLET | Freq: Two times a day (BID) | ORAL | 0 refills | Status: DC

## 2017-09-14 NOTE — Patient Instructions (Signed)
If the symptoms cannot last for more than a week to 10 days, please return to the clinic as you would need to be tested for mononucleosis and additional blood work and evaluation at that time.  Otherwise please use Advil\Tylenol as needed for fever and pain.  Please take all of the antibiotics as prescribed for your condition.   Strep Throat Strep throat is a bacterial infection of the throat. Your health care provider may call the infection tonsillitis or pharyngitis, depending on whether there is swelling in the tonsils or at the back of the throat. Strep throat is most common during the cold months of the year in children who are 46-42 years of age, but it can happen during any season in people of any age. This infection is spread from person to person (contagious) through coughing, sneezing, or close contact. What are the causes? Strep throat is caused by the bacteria called Streptococcus pyogenes. What increases the risk? This condition is more likely to develop in:  People who spend time in crowded places where the infection can spread easily.  People who have close contact with someone who has strep throat.  What are the signs or symptoms? Symptoms of this condition include:  Fever or chills.  Redness, swelling, or pain in the tonsils or throat.  Pain or difficulty when swallowing.  White or yellow spots on the tonsils or throat.  Swollen, tender glands in the neck or under the jaw.  Red rash all over the body (rare).  How is this diagnosed? This condition is diagnosed by performing a rapid strep test or by taking a swab of your throat (throat culture test). Results from a rapid strep test are usually ready in a few minutes, but throat culture test results are available after one or two days. How is this treated? This condition is treated with antibiotic medicine. Follow these instructions at home: Medicines  Take over-the-counter and prescription medicines only as told by  your health care provider.  Take your antibiotic as told by your health care provider. Do not stop taking the antibiotic even if you start to feel better.  Have family members who also have a sore throat or fever tested for strep throat. They may need antibiotics if they have the strep infection. Eating and drinking  Do not share food, drinking cups, or personal items that could cause the infection to spread to other people.  If swallowing is difficult, try eating soft foods until your sore throat feels better.  Drink enough fluid to keep your urine clear or pale yellow. General instructions  Gargle with a salt-water mixture 3-4 times per day or as needed. To make a salt-water mixture, completely dissolve -1 tsp of salt in 1 cup of warm water.  Make sure that all household members wash their hands well.  Get plenty of rest.  Stay home from school or work until you have been taking antibiotics for 24 hours.  Keep all follow-up visits as told by your health care provider. This is important. Contact a health care provider if:  The glands in your neck continue to get bigger.  You develop a rash, cough, or earache.  You cough up a thick liquid that is green, yellow-brown, or bloody.  You have pain or discomfort that does not get better with medicine.  Your problems seem to be getting worse rather than better.  You have a fever. Get help right away if:  You have new symptoms, such as vomiting,  severe headache, stiff or painful neck, chest pain, or shortness of breath.  You have severe throat pain, drooling, or changes in your voice.  You have swelling of the neck, or the skin on the neck becomes red and tender.  You have signs of dehydration, such as fatigue, dry mouth, and decreased urination.  You become increasingly sleepy, or you cannot wake up completely.  Your joints become red or painful. This information is not intended to replace advice given to you by your health  care provider. Make sure you discuss any questions you have with your health care provider. Document Released: 04/29/2000 Document Revised: 12/30/2015 Document Reviewed: 08/25/2014 Elsevier Interactive Patient Education  [employer].

## 2017-09-14 NOTE — Progress Notes (Signed)
Acute Care Office visit  Assessment and plan:  1. Sore throat   2. Fever, unspecified fever cause   3. Nausea   4. Pharyngitis due to Streptococcus species     1. Sore Throat - Viral vs Allergic vs Bacterial causes for pt's symptoms reveiwed.     - Antibiotics prescribed today due to physical exam findings consistent with streptococcal pharyngitis as well as HPI.  Will treat with antibiotics and send out culture.  Did warn patient about mononucleosis and if symptoms last past 7 to 10 days she is to return to clinic for further labs including CBC, Monospot and abdominal exam etc..  - Supportive care and various OTC medications discussed in addition to any prescribed. - Advised patient to alternate tylenol and ibuprofen as recommended.  - Advised patient to drink plenty of water, more than normal, especially given her fever (101.5).  - Patient should not return to work until fever has resolved for over 24 hours.  - Liquid diet recommended while the patient's nausea continues.  2. Follow-Up - Call or RTC if new symptoms, or if no improvement or worse over next several days.      Meds ordered this encounter  Medications  . amoxicillin (AMOXIL) 875 MG tablet    Sig: Take 1 tablet (875 mg total) by mouth 2 (two) times daily.    Dispense:  20 tablet    Refill:  0    Orders Placed This Encounter  Procedures  . Culture, Group A Strep  . POCT rapid strep A    Gross side effects, risk and benefits, and alternatives of medications discussed with patient.  Patient is aware that all medications have potential side effects and we are unable to predict every sideeffect or drug-drug interaction that may occur.  Expresses verbal understanding and consents to current therapy plan and treatment regiment.   Education and routine counseling performed. Handouts provided.  Anticipatory guidance and routine counseling done re: condition, txmnt options and need for follow up. All  questions of patient's were answered.  Return if symptoms worsen or fail to improve.  Please see AVS handed out to patient at the end of our visit for additional patient instructions/ counseling done pertaining to today's office visit.  Note: This document was partially repared using Dragon voice recognition software and may include unintentional dictation errors.  This document serves as a record of services personally performed by Thomasene Lot, DO. It was created on her behalf by Peggye Fothergill, a trained medical scribe. The creation of this record is based on the scribe's personal observations and the provider's statements to them.   I have reviewed the above medical documentation for accuracy and completeness and I concur.  Thomasene Lot 09/14/17 5:05 PM    Subjective:    Chief Complaint  Patient presents with  . Sore Throat    with fever   Works as a Scientist, physiological in accounts payable, Technical brewer, [employer].  No sick contacts with children.  Has no sick children at home.  No known sick contacts recently with anyone feverish or ill recently.  HPI:  Pt presents with Sx for 2 days.  Woke up yesterday with her throat hurting on left side.  This morning she woke up at 4 AM, hot, and couldn't get comfortable.  Notes that she couldn't initially find her thermometer this morning, but felt feverish and sweating.   C/o:  Sore throat yesterday, fever today.  She feels nauseous and her throat is  still hurting.  Notes that her fever keeps going from 101 to 99 and back.  Feels a little achy, but notes that her back hurts all the time because she has bulging disk.  No severe aches out of her norms, but admits to feeling achier during her fever.  Notes that she gets fevers very rarely, maybe two per year.  Denies:  Headache, runny nose, cough, congestion.    For symptoms patient has tried:  Drinking water.  Took tylenol this morning to reduce her fever, but not since.  Overall getting:    Worsening today with fever onset this morning.    Patient Care Team    Relationship Specialty Notifications Start End  Crescent Bar, Orpha Bur D, NP PCP - General Family Medicine  05/04/17   Alena Bills, MD Consulting Physician Pediatrics  05/04/17     Past medical history, Surgical history, Family history reviewed and noted below, Social history, Allergies, and Medications have been entered into the medical record, reviewed and changed as needed.   Allergies  Allergen Reactions  . Other Itching    Cats, dogs    Review of Systems: - see above HPI for pertinent positives General:   No F/C, wt loss Pulm:   No DIB, pleuritic chest pain Card:  No CP, palpitations Abd:  No n/v/d or pain Ext:  No inc edema from baseline   Objective:   Blood pressure 132/83, pulse (!) 110, temperature (!) 101.5 F (38.6 C), height 5' 4.5" (1.638 m), weight 201 lb 12.8 oz (91.5 kg), SpO2 99 %. Body mass index is 34.1 kg/m. General: Well Developed, well nourished, appropriate for stated age.  Neuro: Alert and oriented x3, extra-ocular muscles intact, sensation grossly intact.  HEENT: Normocephalic, atraumatic, pupils equal round reactive to light, neck supple, no masses, tender lymphadenopathy, left greater than right, anterior cervical region.  TM's intact B/L, no acute findings. Nares- patent, clear d/c, OP- tonsils enlarged bilaterally with whitish-yellowish exudate on bilateral surfaces, with significant erythema, No TTP sinuses Skin: Warm and dry, no gross rash. Cardiac: RRR, S1 S2,  no murmurs rubs or gallops.  Respiratory: ECTA B/L and A/P, Not using accessory muscles, speaking in full sentences- unlabored. Vascular:  No gross lower ext edema, cap RF less 2 sec. Psych: No HI/SI, judgement and insight good, Euthymic mood. Full Affect.

## 2017-09-17 LAB — CULTURE, GROUP A STREP: Strep A Culture: NEGATIVE

## 2017-09-22 DIAGNOSIS — L2089 Other atopic dermatitis: Secondary | ICD-10-CM | POA: Diagnosis not present

## 2017-12-19 DIAGNOSIS — R102 Pelvic and perineal pain: Secondary | ICD-10-CM | POA: Diagnosis not present

## 2018-01-03 DIAGNOSIS — R102 Pelvic and perineal pain: Secondary | ICD-10-CM | POA: Diagnosis not present

## 2018-01-19 ENCOUNTER — Encounter: Payer: Self-pay | Admitting: Adult Health

## 2018-01-19 ENCOUNTER — Encounter: Payer: Self-pay | Admitting: Family Medicine

## 2018-01-22 ENCOUNTER — Other Ambulatory Visit: Payer: Self-pay | Admitting: Adult Health

## 2018-01-22 MED ORDER — ALBUTEROL SULFATE HFA 108 (90 BASE) MCG/ACT IN AERS: 2.0000 | INHALATION_SPRAY | Freq: Four times a day (QID) | RESPIRATORY_TRACT | 0 refills | Status: DC | PRN

## 2018-01-22 NOTE — Progress Notes (Unsigned)
P 

## 2018-01-23 ENCOUNTER — Encounter: Payer: Self-pay | Admitting: Adult Health

## 2018-01-23 ENCOUNTER — Ambulatory Visit (INDEPENDENT_AMBULATORY_CARE_PROVIDER_SITE_OTHER): Payer: BLUE CROSS/BLUE SHIELD | Admitting: Adult Health

## 2018-01-23 VITALS — BP 117/75 | HR 74 | Ht 64.5 in | Wt 197.4 lb

## 2018-01-23 DIAGNOSIS — R350 Frequency of micturition: Secondary | ICD-10-CM | POA: Diagnosis not present

## 2018-01-23 DIAGNOSIS — R109 Unspecified abdominal pain: Secondary | ICD-10-CM | POA: Diagnosis not present

## 2018-01-23 LAB — POCT URINALYSIS DIPSTICK
Bilirubin, UA: NEGATIVE
Blood, UA: NEGATIVE
Glucose, UA: NEGATIVE
KETONES UA: NEGATIVE
LEUKOCYTES UA: NEGATIVE
NITRITE UA: NEGATIVE
PH UA: 6 (ref 5.0–8.0)
PROTEIN UA: NEGATIVE
Urobilinogen, UA: 0.2 E.U./dL

## 2018-01-23 NOTE — Patient Instructions (Signed)
Abdominal Pain, Adult Many things can cause belly (abdominal) pain. Most times, belly pain is not dangerous. Many cases of belly pain can be watched and treated at home. Sometimes belly pain is serious, though. Your doctor will try to find the cause of your belly pain. Follow these instructions at home:  Take over-the-counter and prescription medicines only as told by your doctor. Do not take medicines that help you poop (laxatives) unless told to by your doctor.  Drink enough fluid to keep your pee (urine) clear or pale yellow.  Watch your belly pain for any changes.  Keep all follow-up visits as told by your doctor. This is important. Contact a doctor if:  Your belly pain changes or gets worse.  You are not hungry, or you lose weight without trying.  You are having trouble pooping (constipated) or have watery poop (diarrhea) for more than 2-3 days.  You have pain when you pee or poop.  Your belly pain wakes you up at night.  Your pain gets worse with meals, after eating, or with certain foods.  You are throwing up and cannot keep anything down.  You have a fever. Get help right away if:  Your pain does not go away as soon as your doctor says it should.  You cannot stop throwing up.  Your pain is only in areas of your belly, such as the right side or the left lower part of the belly.  You have bloody or black poop, or poop that looks like tar.  You have very bad pain, cramping, or bloating in your belly.  You have signs of not having enough fluid or water in your body (dehydration), such as: ? Dark pee, very little pee, or no pee. ? Cracked lips. ? Dry mouth. ? Sunken eyes. ? Sleepiness. ? Weakness. This information is not intended to replace advice given to you by your health care provider. Make sure you discuss any questions you have with your health care provider. Document Released: 10/19/2007 Document Revised: 11/20/2015 Document Reviewed: 10/14/2015 Elsevier  Interactive Patient Education  2018 [employer].  Urinalysis is normal. Order for abdominal ultrasound placed. We will call you when results are available. FEEL BETTER!

## 2018-01-23 NOTE — Assessment & Plan Note (Signed)
UA Neg [place] Abd Complete ordered Remain well hydrated, eat a healthy diet, and continue regular exercise.

## 2018-01-23 NOTE — Progress Notes (Signed)
Subjective:    Patient ID: Kelsey Ayers, female    DOB: May 14, 1996, 22 y.o.   MRN: [id number]  HPI :  [name] presents with RLQ and periumbilical pain that initially started 6 weeks ago. She was seen by her OB/GYN and UA and transvaginal US were both negative. The pain resolved afterwards, however developed again 5 days ago. Pain is constant, described as "pushing in pressure", and rated 7/10. She reports urinary frequency and mild nausea without vomiting associated with pain. She denies constipation/diarrhe/fever/night sweats. Eating or certain type of foods do not influence abdominal pain. She reports poor appetite the last few days. She denies acute trauma prior to onset of sx's. She has been increasing regular exercise, however denies heavy abdominal /core training regime.  Patient Care Team    Relationship Specialty Notifications Start End  William Hamburger D, NP PCP - General Family Medicine  05/04/17   Alena Bills, MD Consulting Physician Pediatrics  05/04/17     Patient Active Problem List   Diagnosis Date Noted  . Abdominal pain, acute 01/23/2018  . Pharyngitis due to Streptococcus species 09/14/2017  . Acute maxillary sinusitis 06/13/2017  . Healthcare maintenance 05/04/2017  . Seasonal allergies 05/04/2017  . Cough 05/04/2017     Past Medical History:  Diagnosis Date  . Allergy      Past Surgical History:  Procedure Laterality Date  . FOOT SURGERY Right   . WISDOM TOOTH EXTRACTION       Family History  Problem Relation Age of Onset  . Hypertension Father   . Diabetes Sister      Social History   Substance and Sexual Activity  Drug Use No     Social History   Substance and Sexual Activity  Alcohol Use Yes  . Alcohol/week: 1.0 standard drinks  . Types: 1 Glasses of wine per week     Social History   Tobacco Use  Smoking Status Never Smoker  Smokeless Tobacco Never Used     Outpatient Encounter Medications as of 01/23/2018   Medication Sig  . albuterol (PROVENTIL HFA;VENTOLIN HFA) 108 (90 Base) MCG/ACT inhaler Inhale 2 puffs into the lungs every 6 (six) hours as needed for wheezing or shortness of breath.  . montelukast (SINGULAIR) 10 MG tablet Take 1 tablet (10 mg total) by mouth at bedtime.  . [DISCONTINUED] amoxicillin (AMOXIL) 875 MG tablet Take 1 tablet (875 mg total) by mouth 2 (two) times daily.   No facility-administered encounter medications on file as of 01/23/2018.     Allergies: Other  Body mass index is 33.36 kg/m.  Blood pressure 117/75, pulse 74, height 5' 4.5" (1.638 m), weight 197 lb 6.4 oz (89.5 kg), SpO2 98 %.    Review of Systems  Constitutional: Positive for fatigue. Negative for activity change, appetite change, chills, diaphoresis, fever and unexpected weight change.  Respiratory: Negative for cough, chest tightness, shortness of breath, wheezing and stridor.   Cardiovascular: Negative for chest pain, palpitations and leg swelling.  Gastrointestinal: Positive for abdominal pain and nausea. Negative for abdominal distention, blood in stool, constipation, diarrhea and vomiting.  Genitourinary: Positive for frequency. Negative for difficulty urinating, dysuria and flank pain.  Neurological: Negative for headaches.  Hematological: Does not bruise/bleed easily.       Objective:   Physical Exam  Constitutional: She appears well-developed and well-nourished. No distress.  HENT:  Head: Normocephalic and atraumatic.  Right Ear: External ear normal.  Left Ear: External ear normal.  Nose: Nose normal.  Mouth/Throat: Oropharynx is clear and moist.  Eyes: Pupils are equal, round, and reactive to light. Conjunctivae and EOM are normal.  Cardiovascular: Normal rate, regular rhythm, normal heart sounds and intact distal pulses.  No murmur heard. Pulmonary/Chest: Effort normal and breath sounds normal. No stridor. No respiratory distress. She has no wheezes. She has no rales. She exhibits  no tenderness.  Abdominal: Soft. Normal appearance and bowel sounds are normal. She exhibits no mass. There is no hepatosplenomegaly. There is tenderness in the right lower quadrant and periumbilical area. There is no rigidity, no rebound, no guarding, no CVA tenderness, no tenderness at McBurney's point and negative Murphy's sign. No hernia.  Skin: Skin is warm and dry. Capillary refill takes less than 2 seconds. No rash noted. She is not diaphoretic. No erythema. No pallor.  Psychiatric: She has a normal mood and affect. Her behavior is normal. Judgment and thought content normal.  Nursing note and vitals reviewed.     Assessment & Plan:   1. Abdominal pain, acute   2. Urinary frequency     Abdominal pain, acute UA Neg [place] Abd Complete ordered Remain well hydrated, eat a healthy diet, and continue regular exercise.     FOLLOW-UP:  Return if symptoms worsen or fail to improve.

## 2018-01-24 ENCOUNTER — Encounter: Payer: Self-pay | Admitting: Adult Health

## 2018-01-25 ENCOUNTER — Encounter: Payer: Self-pay | Admitting: Adult Health

## 2018-02-01 ENCOUNTER — Other Ambulatory Visit: Payer: Self-pay | Admitting: Adult Health

## 2018-02-01 DIAGNOSIS — R109 Unspecified abdominal pain: Secondary | ICD-10-CM

## 2018-02-02 ENCOUNTER — Inpatient Hospital Stay: Admission: RE | Admit: 2018-02-02 | Payer: BLUE CROSS/BLUE SHIELD | Source: Ambulatory Visit

## 2018-02-08 ENCOUNTER — Ambulatory Visit
Admission: RE | Admit: 2018-02-08 | Discharge: 2018-02-08 | Disposition: A | Discharge status: Home / Self Care | Payer: BLUE CROSS/BLUE SHIELD | Source: Ambulatory Visit | Attending: Adult Health | Admitting: Adult Health

## 2018-02-08 DIAGNOSIS — R109 Unspecified abdominal pain: Secondary | ICD-10-CM

## 2018-02-08 DIAGNOSIS — R110 Nausea: Secondary | ICD-10-CM | POA: Diagnosis not present

## 2018-02-08 MED ORDER — IOPAMIDOL (ISOVUE-300) INJECTION 61%
100.0000 mL | Freq: Once | INTRAVENOUS | Status: AC | PRN
  Administered 2018-02-08: 100 mL via INTRAVENOUS

## 2018-02-13 ENCOUNTER — Other Ambulatory Visit: Payer: Self-pay

## 2018-02-13 DIAGNOSIS — R102 Pelvic and perineal pain: Secondary | ICD-10-CM

## 2018-02-13 DIAGNOSIS — R350 Frequency of micturition: Secondary | ICD-10-CM

## 2018-02-13 DIAGNOSIS — N3289 Other specified disorders of bladder: Secondary | ICD-10-CM

## 2018-03-15 DIAGNOSIS — R1033 Periumbilical pain: Secondary | ICD-10-CM | POA: Diagnosis not present

## 2018-04-03 ENCOUNTER — Encounter: Payer: Self-pay | Admitting: Adult Health

## 2018-05-14 DIAGNOSIS — Z01419 Encounter for gynecological examination (general) (routine) without abnormal findings: Secondary | ICD-10-CM | POA: Diagnosis not present

## 2018-05-14 DIAGNOSIS — Z6832 Body mass index (BMI) 32.0-32.9, adult: Secondary | ICD-10-CM | POA: Diagnosis not present

## 2018-05-14 DIAGNOSIS — Z124 Encounter for screening for malignant neoplasm of cervix: Secondary | ICD-10-CM | POA: Diagnosis not present

## 2018-05-14 DIAGNOSIS — Z130 Encounter for screening for diseases of the blood and blood-forming organs and certain disorders involving the immune mechanism: Secondary | ICD-10-CM | POA: Diagnosis not present

## 2018-05-17 ENCOUNTER — Encounter: Payer: Self-pay | Admitting: Adult Health

## 2018-06-04 NOTE — Progress Notes (Signed)
Subjective:    Patient ID: Kelsey OpitzHannah M Stormer, female    DOB: [birth year], 23 y.o.   MRN: [id number]  HPI:  Ms. Tomma LightningSneed presents with increased anxiety r/t to driving and "just life". She reports anxiety "on/off the last several years". [facility] and still gets anxious when traffic gets close to the car she is in. She denies thoughts of harming herself/others She exercises 3 times week- cardio, wt training  She drinks >80 oz water/day and has been following heart healthy diet She denies tobacco/vape use She reports very limited ETOH use She has never been on SSRi/SNRI previously  Declined CBT referral Patient Care Team    Relationship Specialty Notifications Start End  Julaine Fusianford, Wess Baney D, NP PCP - General Family Medicine  05/04/17   Alena BillsLittle, Edgar, MD Consulting Physician Pediatrics  05/04/17     Patient Active Problem List   Diagnosis Date Noted  . GAD (generalized anxiety disorder) 06/06/2018  . Abdominal pain, acute 01/23/2018  . Pharyngitis due to Streptococcus species 09/14/2017  . Acute maxillary sinusitis 06/13/2017  . Healthcare maintenance 05/04/2017  . Seasonal allergies 05/04/2017  . Cough 05/04/2017     Past Medical History:  Diagnosis Date  . Allergy      Past Surgical History:  Procedure Laterality Date  . FOOT SURGERY Right   . WISDOM TOOTH EXTRACTION       Family History  Problem Relation Age of Onset  . Hypertension Father   . Diabetes Sister      Social History   Substance and Sexual Activity  Drug Use No     Social History   Substance and Sexual Activity  Alcohol Use Yes  . Alcohol/week: 1.0 standard drinks  . Types: 1 Glasses of wine per week     Social History   Tobacco Use  Smoking Status Never Smoker  Smokeless Tobacco Never Used     Outpatient Encounter Medications as of 06/06/2018  Medication Sig  . albuterol (PROVENTIL HFA;VENTOLIN HFA) 108 (90 Base) MCG/ACT inhaler Inhale 2 puffs into the lungs every 6 (six)  hours as needed for wheezing or shortness of breath.  . [DISCONTINUED] montelukast (SINGULAIR) 10 MG tablet Take 1 tablet (10 mg total) by mouth at bedtime.  Marland Kitchen. FLUoxetine (PROZAC) 20 MG tablet 1/2 tablet by mouth daily for one week, then in crease to one full tablet daily  . montelukast (SINGULAIR) 10 MG tablet Take 1 tablet (10 mg total) by mouth at bedtime.   No facility-administered encounter medications on file as of 06/06/2018.     Allergies: Other  Body mass index is 33.85 kg/m.  Blood pressure 122/74, pulse 66, temperature 99.1 F (37.3 C), temperature source Oral, height 5' 4.5" (1.638 m), weight 200 lb 4.8 oz (90.9 kg), last menstrual period 05/20/2018, SpO2 100 %.  Review of Systems  Constitutional: Positive for fatigue. Negative for activity change, appetite change, chills, diaphoresis, fever and unexpected weight change.  HENT: Negative for congestion.   Eyes: Negative for visual disturbance.  Respiratory: Negative for cough, chest tightness, shortness of breath, wheezing and stridor.   Cardiovascular: Negative for chest pain, palpitations and leg swelling.  Gastrointestinal: Negative for abdominal distention, abdominal pain, anal bleeding, blood in stool, constipation, diarrhea, nausea, rectal pain and vomiting.  Endocrine: Negative for cold intolerance, heat intolerance, polydipsia, polyphagia and polyuria.  Genitourinary: Negative for difficulty urinating and flank pain.  Musculoskeletal: Negative for arthralgias, back pain, gait problem, joint swelling, myalgias, neck pain and neck  stiffness.  Skin: Negative for color change, pallor, rash and wound.  Neurological: Negative for dizziness and headaches.  Hematological: Does not bruise/bleed easily.  Psychiatric/Behavioral: Negative for agitation, behavioral problems, confusion, decreased concentration, dysphoric mood, hallucinations, self-injury, sleep disturbance and suicidal ideas. The patient is nervous/anxious. The  patient is not hyperactive.        Objective:   Physical Exam Vitals signs and nursing note reviewed.  Constitutional:      General: She is not in acute distress.    Appearance: She is not ill-appearing, toxic-appearing or diaphoretic.  HENT:     Head: Normocephalic and atraumatic.  Eyes:     Extraocular Movements: Extraocular movements intact.     Conjunctiva/sclera: Conjunctivae normal.     Pupils: Pupils are equal, round, and reactive to light.  Cardiovascular:     Rate and Rhythm: Normal rate.     Pulses: Normal pulses.     Heart sounds: Normal heart sounds. No murmur. No friction rub. No gallop.   Pulmonary:     Effort: Pulmonary effort is normal. No respiratory distress.     Breath sounds: Normal breath sounds. No stridor. No wheezing, rhonchi or rales.  Chest:     Chest wall: No tenderness.  Skin:    General: Skin is warm.  Neurological:     Mental Status: She is alert and oriented to person, place, and time.     Coordination: Coordination normal.  Psychiatric:        Mood and Affect: Mood normal.        Behavior: Behavior normal.        Thought Content: Thought content normal.        Judgment: Judgment normal.       Assessment & Plan:   1. Healthcare maintenance   2. Seasonal allergies   3. GAD (generalized anxiety disorder)   4. Need for influenza vaccination     Healthcare maintenance  Please take medications as directed. Continue to drink plenty of water, follow heart healthy diet, and continue regular exercise. Please schedule follow-up appt in 4 weeks, please make it a morning appt and come fasting- we need to obtain complete panel of lab work- that was ordered Dec 2018.  Seasonal allergies Singulair 10mg  QD Drink plenty of water  GAD (generalized anxiety disorder) Denies thoughts of harming herself/others Start Fluoxetine 20mg - 1/2 tab daily for one week, then increase to one full tab QD F/u 4 weeks Declined CBT referral Continue regular  exercise    FOLLOW-UP:  Return in about 4 weeks (around 07/04/2018) for Regular Follow Up, General Anxiety Disorder, Evaluate Medication Effectiveness.

## 2018-06-06 ENCOUNTER — Encounter: Payer: Self-pay | Admitting: Adult Health

## 2018-06-06 ENCOUNTER — Ambulatory Visit (INDEPENDENT_AMBULATORY_CARE_PROVIDER_SITE_OTHER): Payer: BLUE CROSS/BLUE SHIELD | Admitting: Adult Health

## 2018-06-06 VITALS — BP 122/74 | HR 66 | Temp 99.1°F | Ht 64.5 in | Wt 200.3 lb

## 2018-06-06 DIAGNOSIS — Z0000 Encounter for general adult medical examination without abnormal findings: Secondary | ICD-10-CM

## 2018-06-06 DIAGNOSIS — J302 Other seasonal allergic rhinitis: Secondary | ICD-10-CM

## 2018-06-06 DIAGNOSIS — F411 Generalized anxiety disorder: Secondary | ICD-10-CM | POA: Insufficient documentation

## 2018-06-06 DIAGNOSIS — Z23 Encounter for immunization: Secondary | ICD-10-CM | POA: Diagnosis not present

## 2018-06-06 MED ORDER — FLUOXETINE HCL 20 MG PO TABS: ORAL_TABLET | ORAL | 1 refills | Status: DC

## 2018-06-06 MED ORDER — MONTELUKAST SODIUM 10 MG PO TABS: 10.0000 mg | ORAL_TABLET | Freq: Every day | ORAL | 3 refills | Status: DC

## 2018-06-06 NOTE — Patient Instructions (Addendum)
Generalized Anxiety Disorder, Adult Generalized anxiety disorder (GAD) is a mental health disorder. People with this condition constantly worry about everyday events. Unlike normal anxiety, worry related to GAD is not triggered by a specific event. These worries also do not fade or get better with time. GAD interferes with life functions, including relationships, work, and school. GAD can vary from mild to severe. People with severe GAD can have intense waves of anxiety with physical symptoms (panic attacks). What are the causes? The exact cause of GAD is not known. What increases the risk? This condition is more likely to develop in:  Women.  People who have a family history of anxiety disorders.  People who are very shy.  People who experience very stressful life events, such as the death of a loved one.  People who have a very stressful family environment. What are the signs or symptoms? People with GAD often worry excessively about many things in their lives, such as their health and family. They may also be overly concerned about:  Doing well at work.  Being on time.  Natural disasters.  Friendships. Physical symptoms of GAD include:  Fatigue.  Muscle tension or having muscle twitches.  Trembling or feeling shaky.  Being easily startled.  Feeling like your heart is pounding or racing.  Feeling out of breath or like you cannot take a deep breath.  Having trouble falling asleep or staying asleep.  Sweating.  Nausea, diarrhea, or irritable bowel syndrome (IBS).  Headaches.  Trouble concentrating or remembering facts.  Restlessness.  Irritability. How is this diagnosed? Your health care provider can diagnose GAD based on your symptoms and medical history. You will also have a physical exam. The health care provider will ask specific questions about your symptoms, including how severe they are, when they started, and if they come and go. Your health care  provider may ask you about your use of alcohol or drugs, including prescription medicines. Your health care provider may refer you to a mental health specialist for further evaluation. Your health care provider will do a thorough examination and may perform additional tests to rule out other possible causes of your symptoms. To be diagnosed with GAD, a person must have anxiety that:  Is out of his or her control.  Affects several different aspects of his or her life, such as work and relationships.  Causes distress that makes him or her unable to take part in normal activities.  Includes at least three physical symptoms of GAD, such as restlessness, fatigue, trouble concentrating, irritability, muscle tension, or sleep problems. Before your health care provider can confirm a diagnosis of GAD, these symptoms must be present more days than they are not, and they must last for six months or longer. How is this treated? The following therapies are usually used to treat GAD:  Medicine. Antidepressant medicine is usually prescribed for long-term daily control. Antianxiety medicines may be added in severe cases, especially when panic attacks occur.  Talk therapy (psychotherapy). Certain types of talk therapy can be helpful in treating GAD by providing support, education, and guidance. Options include: ? Cognitive behavioral therapy (CBT). People learn coping skills and techniques to ease their anxiety. They learn to identify unrealistic or negative thoughts and behaviors and to replace them with positive ones. ? Acceptance and commitment therapy (ACT). This treatment teaches people how to be mindful as a way to cope with unwanted thoughts and feelings. ? Biofeedback. This process trains you to manage your body's response (  physiological response) through breathing techniques and relaxation methods. You will work with a therapist while machines are used to monitor your physical symptoms.  Stress  management techniques. These include yoga, meditation, and exercise. A mental health specialist can help determine which treatment is best for you. Some people see improvement with one type of therapy. However, other people require a combination of therapies. Follow these instructions at home:  Take over-the-counter and prescription medicines only as told by your health care provider.  Try to maintain a normal routine.  Try to anticipate stressful situations and allow extra time to manage them.  Practice any stress management or self-calming techniques as taught by your health care provider.  Do not punish yourself for setbacks or for not making progress.  Try to recognize your accomplishments, even if they are small.  Keep all follow-up visits as told by your health care provider. This is important. Contact a health care provider if:  Your symptoms do not get better.  Your symptoms get worse.  You have signs of depression, such as: ? A persistently sad, cranky, or irritable mood. ? Loss of enjoyment in activities that used to bring you joy. ? Change in weight or eating. ? Changes in sleeping habits. ? Avoiding friends or family members. ? Loss of energy for normal tasks. ? Feelings of guilt or worthlessness. Get help right away if:  You have serious thoughts about hurting yourself or others. If you ever feel like you may hurt yourself or others, or have thoughts about taking your own life, get help right away. You can go to your nearest emergency department or call:  Your local emergency services (911 in the [place]  A suicide crisis helpline, such as the National Suicide Prevention Lifeline at (260)297-6895. This is open 24 hours a day. Summary  Generalized anxiety disorder (GAD) is a mental health disorder that involves worry that is not triggered by a specific event.  People with GAD often worry excessively about many things in their lives, such as their health and  family.  GAD may cause physical symptoms such as restlessness, trouble concentrating, sleep problems, frequent sweating, nausea, diarrhea, headaches, and trembling or muscle twitching.  A mental health specialist can help determine which treatment is best for you. Some people see improvement with one type of therapy. However, other people require a combination of therapies. This information is not intended to replace advice given to you by your health care provider. Make sure you discuss any questions you have with your health care provider. Document Released: 08/27/2012 Document Revised: 03/22/2016 Document Reviewed: 03/22/2016 Elsevier Interactive Patient Education  2019 [employer]   Hives Hives (urticaria) are itchy, red, swollen areas on the skin. Hives can appear on any part of the body. Hives often fade within 24 hours (acute hives). Sometimes, new hives appear after old ones fade and the cycle can continue for several days or weeks (chronic hives). Hives do not spread from person to person (are not contagious). Hives come from the body's reaction to something a person is allergic to (allergen), something that causes irritation, or various other triggers. When a person is exposed to a trigger, his or her body releases a chemical (histamine) that causes redness, itching, and swelling. Hives can appear right after exposure to a trigger or hours later. What are the causes? This condition may be caused by:  Allergies to foods or ingredients.  Insect bites or stings.  Exposure to pollen or pets.  Contact with latex  or chemicals.  Spending time in sunlight, heat, or cold (exposure).  Exercise.  Stress.  Certain medicines. You can also get hives from other medical conditions and treatments, such as:  Viruses, including the common cold.  Bacterial infections, such as urinary tract infections and strep throat.  Certain medicines.  Allergy shots.  Blood  transfusions. Sometimes, the cause of this condition is not known (idiopathic hives). What increases the risk? You are more likely to develop this condition if you:  Are a woman.  Have food allergies, especially to citrus fruits, milk, eggs, peanuts, tree nuts, or shellfish.  Are allergic to: ? Medicines. ? Latex. ? Insects. ? Animals. ? Pollen. What are the signs or symptoms? Common symptoms of this condition include raised, itchy, red or white bumps or patches on your skin. These areas may:  Become large and swollen (welts).  Change in shape and location, quickly and repeatedly.  Be separate hives or connect over a large area of skin.  Sting or become painful.  Turn white when pressed in the center (blanch). In severe cases, yourhands, feet, and face may also become swollen. This may occur if hives develop deeper in your skin. How is this diagnosed? This condition may be diagnosed by your symptoms, medical history, and physical exam.  Your skin, urine, or blood may be tested to find out what is causing your hives and to rule out other health issues.  Your health care provider may also remove a small sample of skin from the affected area and examine it under a microscope (biopsy). How is this treated? Treatment for this condition depends on the cause and severity of your symptoms. Your health care provider may recommend using cool, wet cloths (cool compresses) or taking cool showers to relieve itching. Treatment may include:  Medicines that help: ? Relieve itching (antihistamines). ? Reduce swelling (corticosteroids). ? Treat infection (antibiotics).  An injectable medicine (omalizumab). Your health care provider may prescribe this if you have chronic idiopathic hives and you continue to have symptoms even after treatment with antihistamines. Severe cases may require an emergency injection of adrenaline (epinephrine) to prevent a life-threatening allergic reaction  (anaphylaxis). Follow these instructions at home: Medicines  Take and apply over-the-counter and prescription medicines only as told by your health care provider.  If you were prescribed an antibiotic medicine, take it as told by your health care provider. Do not stop using the antibiotic even if you start to feel better. Skin care  Apply cool compresses to the affected areas.  Do not scratch or rub your skin. General instructions  Do not take hot showers or baths. This can make itching worse.  Do not wear tight-fitting clothing.  Use sunscreen and wear protective clothing when you are outside.  Avoid any substances that cause your hives. Keep a journal to help track what causes your hives. Write down: ? What medicines you take. ? What you eat and drink. ? What products you use on your skin.  Keep all follow-up visits as told by your health care provider. This is important. Contact a health care provider if:  Your symptoms are not controlled with medicine.  Your joints are painful or swollen. Get help right away if:  You have a fever.  You have pain in your abdomen.  Your tongue or lips are swollen.  Your eyelids are swollen.  Your chest or throat feels tight.  You have trouble breathing or swallowing. These symptoms may represent a serious problem that is  an emergency. Do not wait to see if the symptoms will go away. Get medical help right away. Call your local emergency services (911 in the [place] Do not drive yourself to the hospital. Summary  Hives (urticaria) are itchy, red, swollen areas on your skin. Hives come from the body's reaction to something a person is allergic to (allergen), something that causes irritation, or various other triggers.  Treatment for this condition depends on the cause and severity of your symptoms.  Avoid any substances that cause your hives. Keep a journal to help track what causes your hives.  Take and apply over-the-counter and  prescription medicines only as told by your health care provider.  Keep all follow-up visits as told by your health care provider. This is important. This information is not intended to replace advice given to you by your health care provider. Make sure you discuss any questions you have with your health care provider. Document Released: 05/02/2005 Document Revised: 11/15/2017 Document Reviewed: 11/15/2017 Elsevier Interactive Patient Education  2019 [employer].  Please take medications as directed. Continue to drink plenty of water, follow heart healthy diet, and continue regular exercise. Please schedule follow-up appt in 4 weeks, please make it a morning appt and come fasting- we need to obtain complete panel of lab work- that was ordered Dec 2018. FEEL BETTER!

## 2018-06-06 NOTE — Assessment & Plan Note (Signed)
Denies thoughts of harming herself/others Start Fluoxetine 20mg - 1/2 tab daily for one week, then increase to one full tab QD F/u 4 weeks Declined CBT referral Continue regular exercise

## 2018-06-06 NOTE — Assessment & Plan Note (Signed)
  Please take medications as directed. Continue to drink plenty of water, follow heart healthy diet, and continue regular exercise. Please schedule follow-up appt in 4 weeks, please make it a morning appt and come fasting- we need to obtain complete panel of lab work- that was ordered Dec 2018.

## 2018-06-06 NOTE — Assessment & Plan Note (Signed)
Singulair 10mg  QD Drink plenty of water

## 2018-06-19 ENCOUNTER — Encounter: Payer: Self-pay | Admitting: Adult Health

## 2018-06-20 ENCOUNTER — Other Ambulatory Visit: Payer: Self-pay | Admitting: Adult Health

## 2018-06-20 MED ORDER — AMOXICILLIN-POT CLAVULANATE 875-125 MG PO TABS: 1.0000 | ORAL_TABLET | Freq: Two times a day (BID) | ORAL | 0 refills | Status: DC

## 2018-07-10 ENCOUNTER — Encounter: Payer: Self-pay | Admitting: Adult Health

## 2018-07-10 ENCOUNTER — Ambulatory Visit (INDEPENDENT_AMBULATORY_CARE_PROVIDER_SITE_OTHER): Payer: BLUE CROSS/BLUE SHIELD | Admitting: Adult Health

## 2018-07-10 VITALS — BP 114/72 | HR 72 | Temp 99.3°F | Ht 64.5 in | Wt 192.0 lb

## 2018-07-10 DIAGNOSIS — Z6832 Body mass index (BMI) 32.0-32.9, adult: Secondary | ICD-10-CM

## 2018-07-10 DIAGNOSIS — Z0000 Encounter for general adult medical examination without abnormal findings: Secondary | ICD-10-CM | POA: Diagnosis not present

## 2018-07-10 DIAGNOSIS — Z6833 Body mass index (BMI) 33.0-33.9, adult: Secondary | ICD-10-CM | POA: Insufficient documentation

## 2018-07-10 DIAGNOSIS — F411 Generalized anxiety disorder: Secondary | ICD-10-CM | POA: Diagnosis not present

## 2018-07-10 MED ORDER — FLUOXETINE HCL 20 MG PO TABS: ORAL_TABLET | ORAL | 1 refills | Status: DC

## 2018-07-10 NOTE — Patient Instructions (Addendum)

## 2018-07-10 NOTE — Assessment & Plan Note (Signed)
Follow-up at annual physical in 6 months, schedule fasting labs the week prior.

## 2018-07-10 NOTE — Assessment & Plan Note (Addendum)
Great job on the weight loss! Once you get settled into your new apartment, resume regular exercise. Follow Mediterranean diet and drink plenty of water. Continue Fluoxetine 20mg  once nightly- hopefully this well reduce daytime fatigue.

## 2018-07-10 NOTE — Assessment & Plan Note (Signed)
She has lost 8 lbs since last OV on 06/06/2018 via "[employer]" in am Discussed that Keto Diet are very high in saturated fat and can negatively impact cholesterol levels Encouraged to follow Mediterranean Diet Once settled into her apartment, resume regular exercise Body mass index is 32.45 kg/m.  Current wt 192

## 2018-07-10 NOTE — Progress Notes (Addendum)
Subjective:    Patient ID: Kelsey Ayers, female    DOB: Jan 20, 1996, 23 y.o.   MRN: [id number] 06/06/2018 OV:   Kelsey Ayers presents with increased anxiety r/t to driving and "just life". She reports anxiety "on/off the last several years". She was in [facility] Sept 2017 and still gets anxious when traffic gets close to the car she is in. She denies thoughts of harming herself/others She exercises 3 times week- cardio, wt training  She drinks >80 oz water/day and has been following heart healthy diet She denies tobacco/vape use She reports very limited ETOH use She has never been on SSRi/SNRI previously  Declined CBT referral  07/10/2018 OV: Kelsey Ayers presents for f/u: GAD She was started on Fluoxetine and has titrated up to 20mg  QD She reports sig reduction in overall anxiety, states "I was able to drive to [place] in the rain the other day and it wasn't even bad!" She denies thoughts of harming herself/others She reports increase in fatigue since being on the 20mg  strength, she reports taking dose in am She has lost >8 lbs since last OV by drinking "Keto Replacement Shake" each morning instead of her morning coffee  She is not exercising, currently in middle of move  She will start regular exercise program once settled into new apartment complex which has a full gym  Patient Care Team    Relationship Specialty Notifications Start End  Julaine Fusi, NP PCP - General Family Medicine  05/04/17   Alena Bills, MD Consulting Physician Pediatrics  05/04/17     Patient Active Problem List   Diagnosis Date Noted  . BMI 32.0-32.9,adult 07/10/2018  . GAD (generalized anxiety disorder) 06/06/2018  . Abdominal pain, acute 01/23/2018  . Pharyngitis due to Streptococcus species 09/14/2017  . Healthcare maintenance 05/04/2017  . Seasonal allergies 05/04/2017  . Cough 05/04/2017     Past Medical History:  Diagnosis Date  . Allergy      Past Surgical History:  Procedure Laterality Date   . FOOT SURGERY Right   . WISDOM TOOTH EXTRACTION       Family History  Problem Relation Age of Onset  . Hypertension Father   . Diabetes Sister      Social History   Substance and Sexual Activity  Drug Use No     Social History   Substance and Sexual Activity  Alcohol Use Yes  . Alcohol/week: 1.0 standard drinks  . Types: 1 Glasses of wine per week     Social History   Tobacco Use  Smoking Status Never Smoker  Smokeless Tobacco Never Used     Outpatient Encounter Medications as of 07/10/2018  Medication Sig  . albuterol (PROVENTIL HFA;VENTOLIN HFA) 108 (90 Base) MCG/ACT inhaler Inhale 2 puffs into the lungs every 6 (six) hours as needed for wheezing or shortness of breath.  Marland Kitchen FLUoxetine (PROZAC) 20 MG tablet 1 tablet by mouth each night  . montelukast (SINGULAIR) 10 MG tablet Take 1 tablet (10 mg total) by mouth at bedtime.  . [DISCONTINUED] FLUoxetine (PROZAC) 20 MG tablet 1/2 tablet by mouth daily for one week, then in crease to one full tablet daily  . [DISCONTINUED] amoxicillin-clavulanate (AUGMENTIN) 875-125 MG tablet Take 1 tablet by mouth 2 (two) times daily.   No facility-administered encounter medications on file as of 07/10/2018.     Allergies: Other  Body mass index is 32.45 kg/m.  Blood pressure 114/72, pulse 72, temperature 99.3 F (37.4 C), temperature source Oral, height 5'  4.5" (1.638 m), weight 192 lb (87.1 kg), last menstrual period 06/11/2018, SpO2 98 %.  Review of Systems  Constitutional: Positive for fatigue. Negative for activity change, appetite change, chills, diaphoresis, fever and unexpected weight change.  HENT: Negative for congestion.   Eyes: Negative for visual disturbance.  Respiratory: Negative for cough, chest tightness, shortness of breath, wheezing and stridor.   Cardiovascular: Negative for chest pain, palpitations and leg swelling.  Gastrointestinal: Negative for abdominal distention, abdominal pain, anal bleeding,  blood in stool, constipation, diarrhea, nausea, rectal pain and vomiting.  Endocrine: Negative for cold intolerance, heat intolerance, polydipsia, polyphagia and polyuria.  Genitourinary: Negative for difficulty urinating and flank pain.  Musculoskeletal: Negative for arthralgias, back pain, gait problem, joint swelling, myalgias, neck pain and neck stiffness.  Skin: Negative for color change, pallor, rash and wound.  Neurological: Negative for dizziness and headaches.  Hematological: Does not bruise/bleed easily.  Psychiatric/Behavioral: Negative for agitation, behavioral problems, confusion, decreased concentration, dysphoric mood, hallucinations, self-injury, sleep disturbance and suicidal ideas. The patient is not nervous/anxious and is not hyperactive.        Objective:   Physical Exam Vitals signs and nursing note reviewed.  Constitutional:      General: She is not in acute distress.    Appearance: She is not ill-appearing, toxic-appearing or diaphoretic.  HENT:     Head: Normocephalic and atraumatic.  Eyes:     Extraocular Movements: Extraocular movements intact.     Conjunctiva/sclera: Conjunctivae normal.     Pupils: Pupils are equal, round, and reactive to light.  Cardiovascular:     Rate and Rhythm: Normal rate.     Pulses: Normal pulses.     Heart sounds: Normal heart sounds. No murmur. No friction rub. No gallop.   Pulmonary:     Effort: Pulmonary effort is normal. No respiratory distress.     Breath sounds: Normal breath sounds. No stridor. No wheezing, rhonchi or rales.  Chest:     Chest wall: No tenderness.  Skin:    General: Skin is warm.  Neurological:     Mental Status: She is alert and oriented to person, place, and time.     Coordination: Coordination normal.  Psychiatric:        Mood and Affect: Mood normal.        Behavior: Behavior normal.        Thought Content: Thought content normal.        Judgment: Judgment normal.       Assessment & Plan:    1. BMI 32.0-32.9,adult   2. GAD (generalized anxiety disorder)   3. Healthcare maintenance     GAD (generalized anxiety disorder) Great job on the weight loss! Once you get settled into your new apartment, resume regular exercise. Follow Mediterranean diet and drink plenty of water. Continue Fluoxetine 20mg  once nightly- hopefully this well reduce daytime fatigue.   Healthcare maintenance Follow-up at annual physical in 6 months, schedule fasting labs the week prior.  BMI 32.0-32.9,adult She has lost 8 lbs since last OV on 06/06/2018 via "Keto Shakes" in am Discussed that Keto Diet are very high in saturated fat and can negatively impact cholesterol levels Encouraged to follow Mediterranean Diet Once settled into her apartment, resume regular exercise Body mass index is 32.45 kg/m.  Current wt 192    FOLLOW-UP:  Return in about 6 months (around 01/08/2019) for CPE, Fasting Labs.

## 2018-07-25 ENCOUNTER — Other Ambulatory Visit: Payer: Self-pay

## 2018-07-25 ENCOUNTER — Encounter: Payer: Self-pay | Admitting: Adult Health

## 2018-07-25 DIAGNOSIS — J329 Chronic sinusitis, unspecified: Secondary | ICD-10-CM

## 2018-07-25 DIAGNOSIS — H6507 Acute serous otitis media, recurrent, unspecified ear: Secondary | ICD-10-CM

## 2018-09-27 ENCOUNTER — Encounter: Payer: Self-pay | Admitting: Adult Health

## 2018-10-03 DIAGNOSIS — M26621 Arthralgia of right temporomandibular joint: Secondary | ICD-10-CM | POA: Diagnosis not present

## 2018-10-03 DIAGNOSIS — H9201 Otalgia, right ear: Secondary | ICD-10-CM | POA: Diagnosis not present

## 2018-10-03 DIAGNOSIS — Z7289 Other problems related to lifestyle: Secondary | ICD-10-CM | POA: Diagnosis not present

## 2018-10-03 DIAGNOSIS — H938X1 Other specified disorders of right ear: Secondary | ICD-10-CM | POA: Diagnosis not present

## 2018-10-11 ENCOUNTER — Encounter: Payer: Self-pay | Admitting: Adult Health

## 2018-10-17 NOTE — Progress Notes (Signed)
Virtual Visit via Telephone Note  I connected with Kelsey OpitzHannah M Ayers on 10/18/2018 at  1:45 PM EDT by telephone and verified that I am speaking with the correct person using two identifiers.  Location: Patient: Home Provider: In Clinic   I discussed the limitations, risks, security and privacy concerns of performing an evaluation and management service by telephone and the availability of in person appointments. I also discussed with the patient that there may be a patient responsible charge related to this service. The patient expressed understanding and agreed to proceed.   History of Present Illness: 06/06/2018 OV:   Kelsey Ayers presents with increased anxiety r/t to driving and "just life". She reports anxiety "on/off the last several years". She was in [facility] and still gets anxious when traffic gets close to the car she is in. She denies thoughts of harming herself/others She exercises 3 times week- cardio, wt training  She drinks >80 oz water/day and has been following heart healthy diet She denies tobacco/vape use She reports very limited ETOH use She has never been on SSRi/SNRI previously  Declined CBT referral  07/10/2018 OV: Kelsey Ayers presents for f/u: GAD She was started on Fluoxetine and has titrated up to 20mg  QD She reports sig reduction in overall anxiety, states "[place] and it wasn't even bad!" She denies thoughts of harming herself/others She reports increase in fatigue since being on the 20mg  strength, she reports taking dose in am She has lost >8 lbs since last OV by drinking "Keto Replacement Shake" each morning instead of her morning coffee  She is not exercising, currently in middle of move  She will start regular exercise program once settled into new apartment complex which has a full gym  10/18/2018 OV: Kelsey Ayers calls in today with increase in overall anxiety She reports emotional liability, specifically "I  feel like I can become sad or hurt feeling much quicker than normal". She reports increase in anxiety at work with pressure to perform, she started this position Jan 2020 and she is still learning nuances of the job. She feels safe in regards to COVID-19 exposure, very few employees on her floor and they are well spaced out. She moved out of her parents home into her own apartment March 2020, she does not have any roommates, therefore she is alone in evenings. She has started walking >1 mile/day when she gets home from work- great! She continues to drink plenty of water >80 oz/day She has been trying to follow heart healthy diet, continues to abstain from tobacco/vape use She consumes ETOH only socially- weekends She denies SI/HI She declined Behavioral Health referral  Patient Care Team    Relationship Specialty Notifications Start End  William Hamburgeranford, Emmaus Brandi D, NP PCP - General Family Medicine  05/04/17   Alena BillsLittle, Edgar, MD Consulting Physician Pediatrics  05/04/17     Patient Active Problem List   Diagnosis Date Noted  . BMI 32.0-32.9,adult 07/10/2018  . GAD (generalized anxiety disorder) 06/06/2018  . Abdominal pain, acute 01/23/2018  . Pharyngitis due to Streptococcus species 09/14/2017  . Healthcare maintenance 05/04/2017  . Seasonal allergies 05/04/2017  . Cough 05/04/2017     Past Medical History:  Diagnosis Date  . Allergy      Past Surgical History:  Procedure Laterality Date  . FOOT SURGERY Right   . WISDOM TOOTH EXTRACTION       Family History  Problem Relation Age  of Onset  . Hypertension Father   . Diabetes Sister      Social History   Substance and Sexual Activity  Drug Use No     Social History   Substance and Sexual Activity  Alcohol Use Yes  . Alcohol/week: 1.0 standard drinks  . Types: 1 Glasses of wine per week     Social History   Tobacco Use  Smoking Status Never Smoker  Smokeless Tobacco Never Used     Outpatient Encounter  Medications as of 10/18/2018  Medication Sig  . albuterol (PROVENTIL HFA;VENTOLIN HFA) 108 (90 Base) MCG/ACT inhaler Inhale 2 puffs into the lungs every 6 (six) hours as needed for wheezing or shortness of breath.  . montelukast (SINGULAIR) 10 MG tablet Take 1 tablet (10 mg total) by mouth at bedtime.  . [DISCONTINUED] FLUoxetine (PROZAC) 20 MG tablet 1 tablet by mouth each night  . FLUoxetine (PROZAC) 40 MG capsule Take 1 capsule (40 mg total) by mouth daily.   No facility-administered encounter medications on file as of 10/18/2018.     Allergies: Other  Body mass index is 32.11 kg/m.  Height 5' 4.5" (1.638 m), weight 190 lb (86.2 kg), last menstrual period 10/10/2018. Review of Systems: General:   Denies fever, chills, unexplained weight loss.  Optho/Auditory:   Denies visual changes, blurred vision/LOV Respiratory:   Denies SOB, DOE more than baseline levels.  Cardiovascular:   Denies chest pain, palpitations, new onset peripheral edema  Gastrointestinal:   Denies nausea, vomiting, diarrhea.  Genitourinary: Denies dysuria, freq/ urgency, flank pain or discharge from genitals.  Endocrine:     Denies hot or cold intolerance, polyuria, polydipsia. Musculoskeletal:   Denies unexplained myalgias, joint swelling, unexplained arthralgias, gait problems.  Skin:  Denies rash, suspicious lesions Neurological:   Denies dizziness, unexplained weakness, numbness  Psychiatric/Behavioral:   Anxiety +, Denies SI/HI, Denies hallucinations  This patient does not have sx concerning for COVID-19 Infection (ie; fever, chills, cough, new or worsening shortness of breath).  Observations/Objective: No acute distress noted during the telephone conversations  Assessment and Plan: Increased Fluoxetine (Prozac) from 20mg  to 40mg  QD Continue regular exercise, remain well hydrated, follow Mediterranean diet Recommend therapy, she declined referral today  Follow Up Instructions: Please send MyChart message  in two weeks and let me know how you are tolerating increase in dosage.   I discussed the assessment and treatment plan with the patient. The patient was provided an opportunity to ask questions and all were answered. The patient agreed with the plan and demonstrated an understanding of the instructions.   The patient was advised to call back or seek an in-person evaluation if the symptoms worsen or if the condition fails to improve as anticipated.  I provided 12 minutes of non-face-to-face time during this encounter.   Julaine Fusi, NP

## 2018-10-18 ENCOUNTER — Ambulatory Visit (INDEPENDENT_AMBULATORY_CARE_PROVIDER_SITE_OTHER): Payer: BC Managed Care – PPO | Admitting: Adult Health

## 2018-10-18 ENCOUNTER — Encounter: Payer: Self-pay | Admitting: Adult Health

## 2018-10-18 ENCOUNTER — Other Ambulatory Visit: Payer: Self-pay

## 2018-10-18 VITALS — Ht 64.5 in | Wt 190.0 lb

## 2018-10-18 DIAGNOSIS — Z6832 Body mass index (BMI) 32.0-32.9, adult: Secondary | ICD-10-CM | POA: Diagnosis not present

## 2018-10-18 DIAGNOSIS — F411 Generalized anxiety disorder: Secondary | ICD-10-CM | POA: Diagnosis not present

## 2018-10-18 MED ORDER — FLUOXETINE HCL 40 MG PO CAPS: 40.0000 mg | ORAL_CAPSULE | Freq: Every day | ORAL | 1 refills | Status: DC

## 2018-10-18 NOTE — Assessment & Plan Note (Signed)
Body mass index is 32.11 kg/m.  Continue regular exercise, remain well hydrated, follow Mediterranean diet

## 2018-10-18 NOTE — Assessment & Plan Note (Signed)
Assessment and Plan: Increased Fluoxetine (Prozac) from 20mg  to 40mg  QD Continue regular exercise, remain well hydrated, follow Mediterranean diet Recommend therapy, she declined referral today  Follow Up Instructions: Please send MyChart message in two weeks and let me know how you are tolerating increase in dosage.   I discussed the assessment and treatment plan with the patient. The patient was provided an opportunity to ask questions and all were answered. The patient agreed with the plan and demonstrated an understanding of the instructions.   The patient was advised to call back or seek an in-person evaluation if the symptoms worsen or if the condition fails to improve as anticipated.

## 2018-11-09 ENCOUNTER — Other Ambulatory Visit: Payer: Self-pay | Admitting: Adult Health

## 2019-01-11 ENCOUNTER — Encounter: Payer: Self-pay | Admitting: Adult Health

## 2019-01-14 NOTE — Progress Notes (Addendum)
Subjective:    Patient ID: Kelsey Ayers, female    DOB: [birth year], 23 y.o.   MRN: [id number]  HPI:  Kelsey Ayers is here to discuss medical wt loss Beginning wt 200 Goal 170 Body mass index is 33.87 kg/m. Previous Weight Loss Strategies-Keto Diet Now following low CHO, higher protein diet She is walking >25 mins/day She denies tobacco/vape/regular ETOH use She denies CP/chest tightness  She has never tried any medication for weight loss [place] Controlled Substance Database reviewed- no controlled substances filled in the last two years Discussed typical course is 3 months- with one month f/u after initially starting rx She has hx of anxiety-currently on Fluoxetine 40mg  QD She reports stable mood, denies SI/H She again declined referral to BH/psycology   Patient Care Team    Relationship Specialty Notifications Start End  William Hamburgeranford, Katy D, NP PCP - General Family Medicine  05/04/17   Alena BillsLittle, Edgar, MD Consulting Physician Pediatrics  05/04/17     Patient Active Problem List   Diagnosis Date Noted  . Abnormal weight gain 03/28/2019  . BMI 33.0-33.9,adult 07/10/2018  . GAD (generalized anxiety disorder) 06/06/2018  . Abdominal pain, acute 01/23/2018  . Pharyngitis due to Streptococcus species 09/14/2017  . Healthcare maintenance 05/04/2017  . Seasonal allergies 05/04/2017  . Cough 05/04/2017     Past Medical History:  Diagnosis Date  . Allergy      Past Surgical History:  Procedure Laterality Date  . FOOT SURGERY Right   . WISDOM TOOTH EXTRACTION       Family History  Problem Relation Age of Onset  . Hypertension Father   . Diabetes Sister      Social History   Substance and Sexual Activity  Drug Use No     Social History   Substance and Sexual Activity  Alcohol Use Yes  . Alcohol/week: 1.0 standard drinks  . Types: 1 Glasses of wine per week     Social History   Tobacco Use  Smoking Status Never Smoker  Smokeless Tobacco Never  Used     Outpatient Encounter Medications as of 01/15/2019  Medication Sig  . FLUoxetine (PROZAC) 40 MG capsule Take 1 capsule (40 mg total) by mouth daily.  . Norethindrone Acetate-Ethinyl Estrad-FE (BLISOVI 24 FE) 1-20 MG-MCG(24) tablet Take 1 tablet by mouth daily.  . [DISCONTINUED] albuterol (PROVENTIL HFA;VENTOLIN HFA) 108 (90 Base) MCG/ACT inhaler Inhale 2 puffs into the lungs every 6 (six) hours as needed for wheezing or shortness of breath.  . [DISCONTINUED] montelukast (SINGULAIR) 10 MG tablet TAKE 1 TABLET BY MOUTH EACH NIGHT AT BEDTIME  . [DISCONTINUED] phentermine 37.5 MG capsule Take 1 capsule (37.5 mg total) by mouth every morning.   No facility-administered encounter medications on file as of 01/15/2019.     Allergies: Other  Body mass index is 33.87 kg/m.  Blood pressure 113/68, pulse 69, temperature 99.7 F (37.6 C), temperature source Oral, height 5' 4.5" (1.638 m), weight 200 lb 6.4 oz (90.9 kg), last menstrual period 12/30/2018, SpO2 99 %.  Review of Systems  Constitutional: Negative for activity change, appetite change, chills, diaphoresis, fatigue, fever and unexpected weight change.  Eyes: Negative for visual disturbance.  Respiratory: Negative for cough, chest tightness, shortness of breath, wheezing and stridor.   Cardiovascular: Negative for chest pain, palpitations and leg swelling.  Gastrointestinal: Negative for abdominal distention, abdominal pain, blood in stool, constipation, diarrhea, nausea and vomiting.  Endocrine: Negative for cold intolerance, heat intolerance, polydipsia, polyphagia and polyuria.  Genitourinary:  Negative for difficulty urinating and flank pain.  Musculoskeletal: Negative for arthralgias, back pain, gait problem, joint swelling, myalgias, neck pain and neck stiffness.  Neurological: Negative for dizziness and headaches.  Hematological: Negative for adenopathy. Does not bruise/bleed easily.  Psychiatric/Behavioral: Negative for  agitation, behavioral problems, confusion, decreased concentration, dysphoric mood, hallucinations, self-injury, sleep disturbance and suicidal ideas. The patient is not nervous/anxious and is not hyperactive.        Objective:   Physical Exam Vitals signs and nursing note reviewed.  Constitutional:      General: She is not in acute distress.    Appearance: Normal appearance. She is obese. She is not ill-appearing, toxic-appearing or diaphoretic.  HENT:     Head: Normocephalic and atraumatic.  Eyes:     Extraocular Movements: Extraocular movements intact.     Conjunctiva/sclera: Conjunctivae normal.     Pupils: Pupils are equal, round, and reactive to light.  Cardiovascular:     Rate and Rhythm: Normal rate and regular rhythm.     Pulses: Normal pulses.     Heart sounds: Normal heart sounds. No murmur. No friction rub. No gallop.   Pulmonary:     Effort: Pulmonary effort is normal. No respiratory distress.     Breath sounds: Normal breath sounds. No stridor. No wheezing, rhonchi or rales.  Chest:     Chest wall: No tenderness.  Skin:    Capillary Refill: Capillary refill takes less than 2 seconds.  Neurological:     Mental Status: She is alert and oriented to person, place, and time.  Psychiatric:        Mood and Affect: Mood normal.        Behavior: Behavior normal.        Thought Content: Thought content normal.        Judgment: Judgment normal.       Assessment & Plan:   1. BMI 33.0-33.9,adult   2. Abnormal weight gain     BMI 33.0-33.9,adult Beginning wt 200 Goal 170 Body mass index is 33.87 kg/m. Increase water intake, strive for at least 100 ounces/day.   Follow Mediterranean diet Increase regular exercise.  Recommend at least 30 minutes daily, 5 days per week of walking,biking, swimming, YouTube/Pinterest workout videos. Continue all medications as directed, with one new addition- Phentermine 37.5 mg capsule each morning. Please schedule fasting lab appt in  1 week, office visit 3-4 weeks. Continue to social distance and wear a mask when in public.   Abnormal weight gain Beginning wt 200 Goal 170 Body mass index is 33.87 kg/m. Increase water intake, strive for at least 100 ounces/day.   Follow Mediterranean diet Increase regular exercise.  Recommend at least 30 minutes daily, 5 days per week of walking,biking, swimming, YouTube/Pinterest workout videos. Continue all medications as directed, with one new addition- Phentermine 37.5 mg capsule each morning. Please schedule fasting lab appt in 1 week, office visit 3-4 weeks. Continue to social distance and wear a mask when in public.     FOLLOW-UP:  Return in about 4 weeks (around 02/12/2019) for Regular Follow Up, Medical Weight Loss, Evaluate Medication Effectiveness.

## 2019-01-15 ENCOUNTER — Encounter: Payer: Self-pay | Admitting: Adult Health

## 2019-01-15 ENCOUNTER — Other Ambulatory Visit: Payer: Self-pay

## 2019-01-15 ENCOUNTER — Ambulatory Visit: Payer: BC Managed Care – PPO | Admitting: Adult Health

## 2019-01-15 DIAGNOSIS — R635 Abnormal weight gain: Secondary | ICD-10-CM

## 2019-01-15 DIAGNOSIS — E669 Obesity, unspecified: Secondary | ICD-10-CM

## 2019-01-15 DIAGNOSIS — Z6833 Body mass index (BMI) 33.0-33.9, adult: Secondary | ICD-10-CM | POA: Diagnosis not present

## 2019-01-15 MED ORDER — PHENTERMINE HCL 37.5 MG PO CAPS: 37.5000 mg | ORAL_CAPSULE | ORAL | 0 refills | Status: DC

## 2019-01-15 NOTE — Patient Instructions (Signed)
Mediterranean Diet A Mediterranean diet refers to food and lifestyle choices that are based on the traditions of countries located on the [employer]. This way of eating has been shown to help prevent certain conditions and improve outcomes for people who have chronic diseases, like kidney disease and heart disease. What are tips for following this plan? Lifestyle  Cook and eat meals together with your family, when possible.  Drink enough fluid to keep your urine clear or pale yellow.  Be physically active every day. This includes: ? Aerobic exercise like running or swimming. ? Leisure activities like gardening, walking, or housework.  Get 7-8 hours of sleep each night.  If recommended by your health care provider, drink red wine in moderation. This means 1 glass a day for nonpregnant women and 2 glasses a day for men. A glass of wine equals 5 oz (150 mL). Reading food labels   Check the serving size of packaged foods. For foods such as rice and pasta, the serving size refers to the amount of cooked product, not dry.  Check the total fat in packaged foods. Avoid foods that have saturated fat or trans fats.  Check the ingredients list for added sugars, such as corn syrup. Shopping  At the grocery store, buy most of your food from the areas near the walls of the store. This includes: ? Fresh fruits and vegetables (produce). ? Grains, beans, nuts, and seeds. Some of these may be available in unpackaged forms or large amounts (in bulk). ? Fresh seafood. ? Poultry and eggs. ? Low-fat dairy products.  Buy whole ingredients instead of prepackaged foods.  Buy fresh fruits and vegetables in-season from local farmers markets.  Buy frozen fruits and vegetables in resealable bags.  If you do not have access to quality fresh seafood, buy precooked frozen shrimp or canned fish, such as tuna, salmon, or sardines.  Buy small amounts of raw or cooked vegetables, salads, or olives from  the deli or salad bar at your store.  Stock your pantry so you always have certain foods on hand, such as olive oil, canned tuna, canned tomatoes, rice, pasta, and beans. Cooking  Cook foods with extra-virgin olive oil instead of using butter or other vegetable oils.  Have meat as a side dish, and have vegetables or grains as your main dish. This means having meat in small portions or adding small amounts of meat to foods like pasta or stew.  Use beans or vegetables instead of meat in common dishes like chili or lasagna.  Experiment with different cooking methods. Try roasting or broiling vegetables instead of steaming or sauteing them.  Add frozen vegetables to soups, stews, pasta, or rice.  Add nuts or seeds for added healthy fat at each meal. You can add these to yogurt, salads, or vegetable dishes.  Marinate fish or vegetables using olive oil, lemon juice, garlic, and fresh herbs. Meal planning   Plan to eat 1 vegetarian meal one day each week. Try to work up to 2 vegetarian meals, if possible.  Eat seafood 2 or more times a week.  Have healthy snacks readily available, such as: ? Vegetable sticks with hummus. ? [place] yogurt. ? Fruit and nut trail mix.  Eat balanced meals throughout the week. This includes: ? Fruit: 2-3 servings a day ? Vegetables: 4-5 servings a day ? Low-fat dairy: 2 servings a day ? Fish, poultry, or lean meat: 1 serving a day ? Beans and legumes: 2 or more servings a week ?  Nuts and seeds: 1-2 servings a day ? Whole grains: 6-8 servings a day ? Extra-virgin olive oil: 3-4 servings a day  Limit red meat and sweets to only a few servings a month What are my food choices?  Mediterranean diet ? Recommended  Grains: Whole-grain pasta. Brown rice. Bulgar wheat. Polenta. Couscous. Whole-wheat bread. [place].  Vegetables: Artichokes. Beets. Broccoli. Cabbage. Carrots. Eggplant. Green beans. Chard. Kale. Spinach. Onions. Leeks. Peas. Squash.  Tomatoes. Peppers. Radishes.  Fruits: Apples. Apricots. Avocado. Berries. Bananas. Cherries. Dates. Figs. Grapes. Lemons. Melon. Oranges. Peaches. Plums. Pomegranate.  Meats and other protein foods: Beans. Almonds. Sunflower seeds. Pine nuts. Peanuts. [place]. Salmon. Scallops. Shrimp. Canones. Tilapia. Clams. Oysters. Eggs.  Dairy: Low-fat milk. Cheese. Greek yogurt.  Beverages: Water. Red wine. Herbal tea.  Fats and oils: Extra virgin olive oil. Avocado oil. Grape seed oil.  Sweets and desserts: [place] yogurt with honey. Baked apples. Poached pears. Trail mix.  Seasoning and other foods: Basil. Cilantro. Coriander. Cumin. Mint. Parsley. Sage. Rosemary. Tarragon. Garlic. Oregano. Thyme. Pepper. Balsalmic vinegar. Tahini. Hummus. Tomato sauce. Olives. Mushrooms. ? Limit these  Grains: Prepackaged pasta or rice dishes. Prepackaged cereal with added sugar.  Vegetables: Deep fried potatoes (french fries).  Fruits: Fruit canned in syrup.  Meats and other protein foods: Beef. Pork. Lamb. Poultry with skin. Hot dogs. [place] Salines.  Dairy: Ice cream. Sour cream. Whole milk.  Beverages: Juice. Sugar-sweetened soft drinks. Beer. Liquor and spirits.  Fats and oils: Butter. Canola oil. Vegetable oil. Beef fat (tallow). Lard.  Sweets and desserts: Cookies. Cakes. Pies. Candy.  Seasoning and other foods: Mayonnaise. Premade sauces and marinades. The items listed may not be a complete list. Talk with your dietitian about what dietary choices are right for you. Summary  The Mediterranean diet includes both food and lifestyle choices.  Eat a variety of fresh fruits and vegetables, beans, nuts, seeds, and whole grains.  Limit the amount of red meat and sweets that you eat.  Talk with your health care provider about whether it is safe for you to drink red wine in moderation. This means 1 glass a day for nonpregnant women and 2 glasses a day for men. A glass of wine equals 5 oz (150 mL). This information  is not intended to replace advice given to you by your health care provider. Make sure you discuss any questions you have with your health care provider. Document Released: 12/24/2015 Document Revised: 12/31/2015 Document Reviewed: 12/24/2015 Elsevier Patient Education  [place].   Increase water intake, strive for at least 100 ounces/day.   Follow Mediterranean diet Increase regular exercise.  Recommend at least 30 minutes daily, 5 days per week of walking,biking, swimming, YouTube/Pinterest workout videos. Continue all medications as directed, with one new addition- Phentermine 37.5 mg capsule each morning. Please schedule fasting lab appt in 1 week, office visit 3-4 weeks. Continue to social distance and wear a mask when in public. GREAT TO SEE YOU!

## 2019-01-15 NOTE — Assessment & Plan Note (Signed)
Beginning wt 200 Goal 170 Body mass index is 33.87 kg/m. Increase water intake, strive for at least 100 ounces/day.   Follow Mediterranean diet Increase regular exercise.  Recommend at least 30 minutes daily, 5 days per week of walking,biking, swimming, YouTube/Pinterest workout videos. Continue all medications as directed, with one new addition- Phentermine 37.5 mg capsule each morning. Please schedule fasting lab appt in 1 week, office visit 3-4 weeks. Continue to social distance and wear a mask when in public.

## 2019-01-23 ENCOUNTER — Other Ambulatory Visit: Payer: Self-pay

## 2019-01-23 DIAGNOSIS — Z0000 Encounter for general adult medical examination without abnormal findings: Secondary | ICD-10-CM

## 2019-01-24 ENCOUNTER — Other Ambulatory Visit: Payer: BC Managed Care – PPO

## 2019-01-24 ENCOUNTER — Other Ambulatory Visit: Payer: Self-pay

## 2019-01-24 DIAGNOSIS — Z0000 Encounter for general adult medical examination without abnormal findings: Secondary | ICD-10-CM | POA: Diagnosis not present

## 2019-01-24 DIAGNOSIS — R635 Abnormal weight gain: Secondary | ICD-10-CM | POA: Diagnosis not present

## 2019-01-24 DIAGNOSIS — E7800 Pure hypercholesterolemia, unspecified: Secondary | ICD-10-CM | POA: Diagnosis not present

## 2019-01-25 LAB — COMPREHENSIVE METABOLIC PANEL
ALT: 13 IU/L (ref 0–32)
AST: 17 IU/L (ref 0–40)
Albumin/Globulin Ratio: 1.6 (ref 1.2–2.2)
Albumin: 4.2 g/dL (ref 3.9–5.0)
Alkaline Phosphatase: 68 IU/L (ref 39–117)
BUN/Creatinine Ratio: 12 (ref 9–23)
BUN: 8 mg/dL (ref 6–20)
Bilirubin Total: 0.2 mg/dL (ref 0.0–1.2)
CO2: 22 mmol/L (ref 20–29)
Calcium: 9.1 mg/dL (ref 8.7–10.2)
Chloride: 104 mmol/L (ref 96–106)
Creatinine, Ser: 0.69 mg/dL (ref 0.57–1.00)
GFR calc Af Amer: 140 mL/min/{1.73_m2} (ref >59)
GFR calc non Af Amer: 121 mL/min/{1.73_m2} (ref >59)
Globulin, Total: 2.7 g/dL (ref 1.5–4.5)
Glucose: 86 mg/dL (ref 65–99)
Potassium: 4.4 mmol/L (ref 3.5–5.2)
Sodium: 137 mmol/L (ref 134–144)
Total Protein: 6.9 g/dL (ref 6.0–8.5)

## 2019-01-25 LAB — CBC WITH DIFFERENTIAL/PLATELET
Basophils Absolute: 0 10*3/uL (ref 0.0–0.2)
Basos: 0 %
EOS (ABSOLUTE): 0.1 10*3/uL (ref 0.0–0.4)
Eos: 1 %
Hematocrit: 42 % (ref 34.0–46.6)
Hemoglobin: 13.5 g/dL (ref 11.1–15.9)
Immature Grans (Abs): 0 10*3/uL (ref 0.0–0.1)
Immature Granulocytes: 0 %
Lymphocytes Absolute: 2.9 10*3/uL (ref 0.7–3.1)
Lymphs: 34 %
MCH: 27.7 pg (ref 26.6–33.0)
MCHC: 32.1 g/dL (ref 31.5–35.7)
MCV: 86 fL (ref 79–97)
Monocytes Absolute: 0.6 10*3/uL (ref 0.1–0.9)
Monocytes: 7 %
Neutrophils Absolute: 4.8 10*3/uL (ref 1.4–7.0)
Neutrophils: 58 %
Platelets: 317 10*3/uL (ref 150–450)
RBC: 4.88 x10E6/uL (ref 3.77–5.28)
RDW: 13.2 % (ref 11.7–15.4)
WBC: 8.3 10*3/uL (ref 3.4–10.8)

## 2019-01-25 LAB — LIPID PANEL
Chol/HDL Ratio: 3.4 ratio (ref 0.0–4.4)
Cholesterol, Total: 169 mg/dL (ref 100–199)
HDL: 50 mg/dL (ref >39)
LDL Chol Calc (NIH): 101 mg/dL — ABNORMAL HIGH (ref 0–99)
Triglycerides: 99 mg/dL (ref 0–149)
VLDL Cholesterol Cal: 18 mg/dL (ref 5–40)

## 2019-01-25 LAB — HEMOGLOBIN A1C
Est. average glucose Bld gHb Est-mCnc: 103 mg/dL
Hgb A1c MFr Bld: 5.2 % (ref 4.8–5.6)

## 2019-01-25 LAB — TSH: TSH: 2.83 u[IU]/mL (ref 0.450–4.500)

## 2019-02-12 ENCOUNTER — Other Ambulatory Visit: Payer: Self-pay | Admitting: Adult Health

## 2019-02-13 NOTE — Progress Notes (Deleted)
Virtual Visit via Video Note  I connected with Kelsey Ayers on 02/14/2019 at  8:45 AM EDT by a video enabled telemedicine application and verified that I am speaking with the correct person using two identifiers.  Location: Patient:Home Provider:In Clinic   I discussed the limitations of evaluation and management by telemedicine and the availability of in person appointments. The patient expressed understanding and agreed to proceed.  History of Present Illness: Kelsey Ayers is here to discuss medical wt loss Beginning wt 200 Goal 170 Body mass index is 33.87 kg/m. Previous Weight Loss Strategies-Keto Diet Now following low CHO, higher protein diet She is walking >25 mins/day She denies tobacco/vape/regular ETOH use She denies CP/chest tightness  She has never tried any medication for weight loss [place] Controlled Substance Database reviewed- no controlled substances filled in the last two years Discussed typical course is 3 months- with one month f/u after initially starting rx She has hx of anxiety-currently on Fluoxetine 40mg  QD She reports stable mood, denies SI/H She again declined referral to BH/psycology   Patient Care Team    Relationship Specialty Notifications Start End  Mina Marble D, NP PCP - General Family Medicine  05/04/17   Johny Drilling, MD Consulting Physician Pediatrics  05/04/17     Patient Active Problem List   Diagnosis Date Noted  . BMI 33.0-33.9,adult 07/10/2018  . GAD (generalized anxiety disorder) 06/06/2018  . Abdominal pain, acute 01/23/2018  . Pharyngitis due to Streptococcus species 09/14/2017  . Healthcare maintenance 05/04/2017  . Seasonal allergies 05/04/2017  . Cough 05/04/2017     Past Medical History:  Diagnosis Date  . Allergy      Past Surgical History:  Procedure Laterality Date  . FOOT SURGERY Right   . WISDOM TOOTH EXTRACTION       Family History  Problem Relation Age of Onset  . Hypertension Father   .  Diabetes Sister      Social History   Substance and Sexual Activity  Drug Use No     Social History   Substance and Sexual Activity  Alcohol Use Yes  . Alcohol/week: 1.0 standard drinks  . Types: 1 Glasses of wine per week     Social History   Tobacco Use  Smoking Status Never Smoker  Smokeless Tobacco Never Used     Outpatient Encounter Medications as of 02/14/2019  Medication Sig  . FLUoxetine (PROZAC) 40 MG capsule Take 1 capsule (40 mg total) by mouth daily.  . Norethindrone Acetate-Ethinyl Estrad-FE (BLISOVI 24 FE) 1-20 MG-MCG(24) tablet Take 1 tablet by mouth daily.  . phentermine 37.5 MG capsule Take 1 capsule (37.5 mg total) by mouth every morning.   No facility-administered encounter medications on file as of 02/14/2019.     Allergies: Other  There is no height or weight on file to calculate BMI.  There were no vitals taken for this visit. Review of Systems: General:   Denies fever, chills, unexplained weight loss.  Optho/Auditory:   Denies visual changes, blurred vision/LOV Respiratory:   Denies SOB, DOE more than baseline levels.  Cardiovascular:   Denies chest pain, palpitations, new onset peripheral edema  Gastrointestinal:   Denies nausea, vomiting, diarrhea.  Genitourinary: Denies dysuria, freq/ urgency, flank pain or discharge from genitals.  Endocrine:     Denies hot or cold intolerance, polyuria, polydipsia. Musculoskeletal:   Denies unexplained myalgias, joint swelling, unexplained arthralgias, gait problems.  Skin:  Denies rash, suspicious lesions Neurological:     Denies dizziness, unexplained  weakness, numbness  Psychiatric/Behavioral:   Denies mood changes, suicidal or homicidal ideations, hallucinations      Observations/Objective:   Assessment and Plan:   Follow Up Instructions:    I discussed the assessment and treatment plan with the patient. The patient was provided an opportunity to ask questions and all were answered.  The patient agreed with the plan and demonstrated an understanding of the instructions.   The patient was advised to call back or seek an in-person evaluation if the symptoms worsen or if the condition fails to improve as anticipated.  I provided *** minutes of non-face-to-face time during this encounter.   Julaine Fusi, NP

## 2019-02-14 ENCOUNTER — Other Ambulatory Visit: Payer: Self-pay

## 2019-02-14 ENCOUNTER — Encounter: Payer: Self-pay | Admitting: Adult Health

## 2019-02-14 ENCOUNTER — Ambulatory Visit (INDEPENDENT_AMBULATORY_CARE_PROVIDER_SITE_OTHER): Payer: BC Managed Care – PPO | Admitting: Adult Health

## 2019-02-14 DIAGNOSIS — R635 Abnormal weight gain: Secondary | ICD-10-CM | POA: Diagnosis not present

## 2019-02-14 DIAGNOSIS — F411 Generalized anxiety disorder: Secondary | ICD-10-CM

## 2019-02-14 DIAGNOSIS — Z6833 Body mass index (BMI) 33.0-33.9, adult: Secondary | ICD-10-CM

## 2019-02-14 MED ORDER — PHENTERMINE HCL 37.5 MG PO CAPS: 37.5000 mg | ORAL_CAPSULE | ORAL | 0 refills | Status: DC

## 2019-02-14 NOTE — Progress Notes (Addendum)
Subjective:    Patient ID: Kelsey Ayers, female    DOB: 01-31-1996, 23 y.o.   MRN: [id number]  HPI:01/15/2019 OV: Kelsey Ayers is here to discuss medical wt loss Beginning wt 200 Goal 170 Body mass index is 33.87 kg/m. Previous Weight Loss Strategies-Keto Diet Now following low CHO, higher protein diet She is walking >25 mins/day She denies tobacco/vape/regular ETOH use She denies CP/chest tightness  She has never tried any medication for weight loss [place] Controlled Substance Database reviewed- no controlled substances filled in the last two years Discussed typical course is 3 months- with one month f/u after initially starting rx She has hx of anxiety-currently on Fluoxetine 40mg  QD She reports stable mood, denies SI/H She again declined referral to BH/psycology 02/14/2019 OV: Kelsey Ayers is here for medical weight loss. She was started on Phentermine 4 weeks ago Starting wt 200 Current wt 192 8 lbs lost! She denies CP/dyspnea/palpitations/dizziness/insomnia She estimates to drink 3 x 32 yeti's of water per. She reports significantly reduced snacking and smaller portions at meals. She reports "walking with friends on weekends", ie shopping. She reports mild constipation- recommended to increase fiber in diet, increase regular exercise, OTC Miralax.  Patient Care Team    Relationship Specialty Notifications Start End  Tomma Lightning D, NP PCP - General Family Medicine  05/04/17   05/06/17, MD Consulting Physician Pediatrics  05/04/17     Patient Active Problem List   Diagnosis Date Noted  . Abnormal weight gain 03/28/2019  . BMI 33.0-33.9,adult 07/10/2018  . GAD (generalized anxiety disorder) 06/06/2018  . Abdominal pain, acute 01/23/2018  . Pharyngitis due to Streptococcus species 09/14/2017  . Healthcare maintenance 05/04/2017  . Seasonal allergies 05/04/2017  . Cough 05/04/2017     Past Medical History:  Diagnosis Date  . Allergy      Past  Surgical History:  Procedure Laterality Date  . FOOT SURGERY Right   . WISDOM TOOTH EXTRACTION       Family History  Problem Relation Age of Onset  . Hypertension Father   . Diabetes Sister      Social History   Substance and Sexual Activity  Drug Use No     Social History   Substance and Sexual Activity  Alcohol Use Yes  . Alcohol/week: 1.0 standard drinks  . Types: 1 Glasses of wine per week     Social History   Tobacco Use  Smoking Status Never Smoker  Smokeless Tobacco Never Used     Outpatient Encounter Medications as of 02/14/2019  Medication Sig  . FLUoxetine (PROZAC) 40 MG capsule Take 1 capsule (40 mg total) by mouth daily.  . Norethindrone Acetate-Ethinyl Estrad-FE (BLISOVI 24 FE) 1-20 MG-MCG(24) tablet Take 1 tablet by mouth daily.  . [DISCONTINUED] phentermine 37.5 MG capsule Take 1 capsule (37.5 mg total) by mouth every morning.  . [DISCONTINUED] phentermine 37.5 MG capsule Take 1 capsule (37.5 mg total) by mouth every morning.   No facility-administered encounter medications on file as of 02/14/2019.     Allergies: Other  Body mass index is 32.57 kg/m.  Blood pressure 112/76, pulse (!) 102, temperature 99 F (37.2 C), temperature source Oral, height 5' 4.5" (1.638 m), weight 192 lb 11.2 oz (87.4 kg), last menstrual period 01/29/2019, SpO2 99 %.  Review of Systems  Constitutional: Negative for activity change, appetite change, chills, diaphoresis, fatigue, fever and unexpected weight change.  Eyes: Negative for visual disturbance.  Respiratory: Negative for cough, chest tightness, shortness of  breath, wheezing and stridor.   Cardiovascular: Negative for chest pain, palpitations and leg swelling.  Gastrointestinal: Negative for abdominal distention, anal bleeding, blood in stool, constipation, diarrhea, nausea and vomiting.  Endocrine: Negative for cold intolerance, heat intolerance, polydipsia, polyphagia and polyuria.  Neurological: Negative  for dizziness and headaches.  Hematological: Negative for adenopathy. Does not bruise/bleed easily.  Psychiatric/Behavioral: Negative for agitation, behavioral problems, confusion, decreased concentration, dysphoric mood, hallucinations, self-injury, sleep disturbance and suicidal ideas. The patient is not nervous/anxious and is not hyperactive.        Objective:   Physical Exam Vitals signs and nursing note reviewed.  Constitutional:      General: She is not in acute distress.    Appearance: She is obese. She is not ill-appearing, toxic-appearing or diaphoretic.  HENT:     Head: Normocephalic and atraumatic.  Eyes:     Extraocular Movements: Extraocular movements intact.     Conjunctiva/sclera: Conjunctivae normal.     Pupils: Pupils are equal, round, and reactive to light.  Cardiovascular:     Rate and Rhythm: Normal rate and regular rhythm.     Pulses: Normal pulses.     Heart sounds: Normal heart sounds. No murmur. No friction rub. No gallop.   Pulmonary:     Effort: Pulmonary effort is normal. No respiratory distress.     Breath sounds: Normal breath sounds. No stridor. No wheezing, rhonchi or rales.  Chest:     Chest wall: No tenderness.  Skin:    Capillary Refill: Capillary refill takes less than 2 seconds.  Neurological:     Mental Status: She is alert and oriented to person, place, and time.  Psychiatric:        Mood and Affect: Mood normal.        Behavior: Behavior normal.        Thought Content: Thought content normal.        Judgment: Judgment normal.       Assessment & Plan:   1. BMI 33.0-33.9,adult   2. GAD (generalized anxiety disorder)   3. Abnormal weight gain     BMI 33.0-33.9,adult [place] Controlled Substance Database reviewed- no aberrancies noted. GREAT job on the weight loss! Blood pressure is good. Remain well hydrated, follow Mediterranean Diet,Increase regular exercise.  Recommend at least 30 minutes daily, 5 days per week of  walking, jogging, biking, swimming, YouTube/Pinterest workout videos. OTC Miralax to help with constipation. Phentermine refilled, when you need final refill, please request from pharmacy- that will complete first 3 month course. One month medication vacation. If you would like to re-start final 3 month course after medication, please schedule office visit in 3 months. Continue to social distance and wear a mask when in public.  GAD (generalized anxiety disorder) Mood stable, denies SI/HI Currently on Fluoxetine 40mg  QD  Abnormal weight gain [place] Controlled Substance Database reviewed- no aberrancies noted. GREAT job on the weight loss! Blood pressure is good. Remain well hydrated, follow Mediterranean Diet,Increase regular exercise.  Recommend at least 30 minutes daily, 5 days per week of walking, jogging, biking, swimming, YouTube/Pinterest workout videos. OTC Miralax to help with constipation. Phentermine refilled, when you need final refill, please request from pharmacy- that will complete first 3 month course. One month medication vacation. If you would like to re-start final 3 month course after medication, please schedule office visit in 3 months. Continue to social distance and wear a mask when in public.     FOLLOW-UP:  Return in about  3 months (around 05/17/2019) for Regular Follow Up, Medical Weight Loss.

## 2019-02-14 NOTE — Assessment & Plan Note (Signed)
[place] Controlled Substance Database reviewed- no aberrancies noted. GREAT job on the weight loss! Blood pressure is good. Remain well hydrated, follow Mediterranean Diet,Increase regular exercise.  Recommend at least 30 minutes daily, 5 days per week of walking, jogging, biking, swimming, YouTube/Pinterest workout videos. OTC Miralax to help with constipation. Phentermine refilled, when you need final refill, please request from pharmacy- that will complete first 3 month course. One month medication vacation. If you would like to re-start final 3 month course after medication, please schedule office visit in 3 months. Continue to social distance and wear a mask when in public.

## 2019-02-14 NOTE — Patient Instructions (Addendum)
Phentermine tablets or capsules What is this medicine? PHENTERMINE (FEN ter meen) decreases your appetite. It is used with a reduced calorie diet and exercise to help you lose weight. This medicine may be used for other purposes; ask your health care provider or pharmacist if you have questions. COMMON BRAND NAME(S): Adipex-P, Atti-Plex P, Atti-Plex P Spansule, Fastin, Lomaira, Pro-Fast, Tara-8 What should I tell my health care provider before I take this medicine? They need to know if you have any of these conditions:  agitation or nervousness  diabetes  glaucoma  heart disease  high blood pressure  history of drug abuse or addiction  history of stroke  kidney disease  lung disease called Primary Pulmonary Hypertension (PPH)  taken an MAOI like Carbex, Eldepryl, Marplan, Nardil, or Parnate in last 14 days  taking stimulant medicines for attention disorders, weight loss, or to stay awake  thyroid disease  an unusual or allergic reaction to phentermine, other medicines, foods, dyes, or preservatives  pregnant or trying to get pregnant  breast-feeding How should I use this medicine? Take this medicine by mouth with a glass of water. Follow the directions on the prescription label. The instructions for use may differ based on the product and dose you are taking. Avoid taking this medicine in the evening. It may interfere with sleep. Take your doses at regular intervals. Do not take your medicine more often than directed. Talk to your pediatrician regarding the use of this medicine in children. While this drug may be prescribed for children 17 years or older for selected conditions, precautions do apply. Overdosage: If you think you have taken too much of this medicine contact a poison control center or emergency room at once. NOTE: This medicine is only for you. Do not share this medicine with others. What if I miss a dose? If you miss a dose, take it as soon as you can. If it  is almost time for your next dose, take only that dose. Do not take double or extra doses. What may interact with this medicine? Do not take this medicine with any of the following medications:  MAOIs like Carbex, Eldepryl, Marplan, Nardil, and Parnate  medicines for colds or breathing difficulties like pseudoephedrine or phenylephrine  procarbazine  sibutramine  stimulant medicines for attention disorders, weight loss, or to stay awake This medicine may also interact with the following medications:  certain medicines for depression, anxiety, or psychotic disturbances  linezolid  medicines for diabetes  medicines for high blood pressure This list may not describe all possible interactions. Give your health care provider a list of all the medicines, herbs, non-prescription drugs, or dietary supplements you use. Also tell them if you smoke, drink alcohol, or use illegal drugs. Some items may interact with your medicine. What should I watch for while using this medicine? Notify your physician immediately if you become short of breath while doing your normal activities. Do not take this medicine within 6 hours of bedtime. It can keep you from getting to sleep. Avoid drinks that contain caffeine and try to stick to a regular bedtime every night. This medicine was intended to be used in addition to a healthy diet and exercise. The best results are achieved this way. This medicine is only indicated for short-term use. Eventually your weight loss may level out. At that point, the drug will only help you maintain your new weight. Do not increase or in any way change your dose without consulting your doctor. You may get   drowsy or dizzy. Do not drive, use machinery, or do anything that needs mental alertness until you know how this medicine affects you. Do not stand or sit up quickly, especially if you are an older patient. This reduces the risk of dizzy or fainting spells. Alcohol may increase  dizziness and drowsiness. Avoid alcoholic drinks. What side effects may I notice from receiving this medicine? Side effects that you should report to your doctor or health care professional as soon as possible:  allergic reactions like skin rash, itching or hives, swelling of the face, lips, or tongue)  anxiety  breathing problems  changes in vision  chest pain or chest tightness  depressed mood or other mood changes  hallucinations, loss of contact with reality  fast, irregular heartbeat  increased blood pressure  irritable  nervousness or restlessness  painful urination  palpitations  tremors  trouble sleeping  seizures  signs and symptoms of a stroke like changes in vision; confusion; trouble speaking or understanding; severe headaches; sudden numbness or weakness of the face, arm or leg; trouble walking; dizziness; loss of balance or coordination  unusually weak or tired  vomiting Side effects that usually do not require medical attention (report to your doctor or health care professional if they continue or are bothersome):  constipation or diarrhea  dry mouth  headache  nausea  stomach upset  sweating This list may not describe all possible side effects. Call your doctor for medical advice about side effects. You may report side effects to FDA at 1-800-FDA-1088. Where should I keep my medicine? Keep out of the reach of children. This medicine can be abused. Keep your medicine in a safe place to protect it from theft. Do not share this medicine with anyone. Selling or giving away this medicine is dangerous and against the law. This medicine may cause accidental overdose and death if taken by other adults, children, or pets. Mix any unused medicine with a substance like cat litter or coffee grounds. Then throw the medicine away in a sealed container like a sealed bag or a coffee can with a lid. Do not use the medicine after the expiration date. Store at  room temperature between 20 and 25 degrees C (68 and 77 degrees F). Keep container tightly closed. NOTE: This sheet is a summary. It may not cover all possible information. If you have questions about this medicine, talk to your doctor, pharmacist, or health care provider.  2020 Elsevier/Gold Standard (2016-10-14 08:23:13)   GREAT job on the weight loss! Blood pressure is good. Remain well hydrated, follow Mediterranean Diet,Increase regular exercise.  Recommend at least 30 minutes daily, 5 days per week of walking, jogging, biking, swimming, YouTube/Pinterest workout videos. OTC Miralax to help with constipation. Phentermine refilled, when you need final refill, please request from pharmacy- that will complete first 3 month course. One month medication vacation. If you would like to re-start final 3 month course after medication, please schedule office visit in 3 months. Continue to social distance and wear a mask when in public. GREAT TO SEE YOU!

## 2019-02-14 NOTE — Assessment & Plan Note (Signed)
Mood stable, denies SI/HI Currently on Fluoxetine 40mg  QD

## 2019-03-18 ENCOUNTER — Other Ambulatory Visit: Payer: Self-pay | Admitting: Adult Health

## 2019-03-18 MED ORDER — PHENTERMINE HCL 37.5 MG PO CAPS: 37.5000 mg | ORAL_CAPSULE | ORAL | 0 refills | Status: DC

## 2019-03-18 NOTE — Telephone Encounter (Signed)
Tonya, System will not let me send Phentermine through. Printed, please have her pick up Rx. Thanks! Valetta Fuller

## 2019-03-28 DIAGNOSIS — R635 Abnormal weight gain: Secondary | ICD-10-CM | POA: Insufficient documentation

## 2019-03-28 NOTE — Assessment & Plan Note (Signed)
[place] Controlled Substance Database reviewed- no aberrancies noted. GREAT job on the weight loss! Blood pressure is good. Remain well hydrated, follow Mediterranean Diet,Increase regular exercise.  Recommend at least 30 minutes daily, 5 days per week of walking, jogging, biking, swimming, YouTube/Pinterest workout videos. OTC Miralax to help with constipation. Phentermine refilled, when you need final refill, please request from pharmacy- that will complete first 3 month course. One month medication vacation. If you would like to re-start final 3 month course after medication, please schedule office visit in 3 months. Continue to social distance and wear a mask when in public. 

## 2019-03-28 NOTE — Assessment & Plan Note (Signed)
Beginning wt 200 Goal 170 Body mass index is 33.87 kg/m. Increase water intake, strive for at least 100 ounces/day.   Follow Mediterranean diet Increase regular exercise.  Recommend at least 30 minutes daily, 5 days per week of walking,biking, swimming, YouTube/Pinterest workout videos. Continue all medications as directed, with one new addition- Phentermine 37.5 mg capsule each morning. Please schedule fasting lab appt in 1 week, office visit 3-4 weeks. Continue to social distance and wear a mask when in public.

## 2019-04-09 ENCOUNTER — Other Ambulatory Visit: Payer: Self-pay | Admitting: Adult Health

## 2019-05-19 ENCOUNTER — Encounter: Payer: Self-pay | Admitting: Adult Health

## 2019-05-20 ENCOUNTER — Telehealth: Payer: Self-pay | Admitting: Adult Health

## 2019-05-20 ENCOUNTER — Encounter: Payer: Self-pay | Admitting: Adult Health

## 2019-05-20 NOTE — Telephone Encounter (Signed)
Patient states that she woke up with eyes burning and itchy, thought she had gotten moisturizer in her eyes. When she looked in the mirror her eyes were swollen. Patient states she took Benadryl and the swelling is 80% better. No new changes in moisturizer or in diet. Only new change is Melatonin gummy 10mg . Patient took 2 gummies.  Patient denies any difficulty with breathing or swallowing.   Please advise. AS, CMA  [facility].

## 2019-05-20 NOTE — Telephone Encounter (Signed)
Attempted to call with no answer. Left message for patient to return my call. AS, CMA

## 2019-07-16 DIAGNOSIS — Z130 Encounter for screening for diseases of the blood and blood-forming organs and certain disorders involving the immune mechanism: Secondary | ICD-10-CM | POA: Diagnosis not present

## 2019-07-16 DIAGNOSIS — E669 Obesity, unspecified: Secondary | ICD-10-CM | POA: Diagnosis not present

## 2019-07-16 DIAGNOSIS — Z1389 Encounter for screening for other disorder: Secondary | ICD-10-CM | POA: Diagnosis not present

## 2019-07-16 DIAGNOSIS — Z01419 Encounter for gynecological examination (general) (routine) without abnormal findings: Secondary | ICD-10-CM | POA: Diagnosis not present

## 2019-07-29 ENCOUNTER — Encounter: Payer: Self-pay | Admitting: Adult Health

## 2019-07-29 ENCOUNTER — Other Ambulatory Visit: Payer: Self-pay | Admitting: Adult Health

## 2019-07-29 MED ORDER — FLUOXETINE HCL 40 MG PO CAPS: 40.0000 mg | ORAL_CAPSULE | Freq: Every day | ORAL | 0 refills | Status: DC

## 2019-07-30 ENCOUNTER — Telehealth: Payer: Self-pay | Admitting: Adult Health

## 2019-07-30 NOTE — Telephone Encounter (Signed)
Per [place] @ [facility] [phone] pt did  NOT have (pap smear on DOS 07/16/2019) annual exam office notes sent to PCP for review.  --FYI to med asst.  --glh

## 2019-08-28 ENCOUNTER — Other Ambulatory Visit: Payer: Self-pay

## 2019-08-28 ENCOUNTER — Ambulatory Visit (INDEPENDENT_AMBULATORY_CARE_PROVIDER_SITE_OTHER): Payer: 59 | Admitting: Family Medicine

## 2019-08-28 ENCOUNTER — Encounter: Payer: Self-pay | Admitting: Family Medicine

## 2019-08-28 VITALS — BP 125/80 | HR 77 | Temp 98.8°F | Resp 10 | Ht 64.17 in | Wt 193.5 lb

## 2019-08-28 DIAGNOSIS — Z6833 Body mass index (BMI) 33.0-33.9, adult: Secondary | ICD-10-CM

## 2019-08-28 DIAGNOSIS — F411 Generalized anxiety disorder: Secondary | ICD-10-CM

## 2019-08-28 DIAGNOSIS — R635 Abnormal weight gain: Secondary | ICD-10-CM

## 2019-08-28 MED ORDER — FLUOXETINE HCL 40 MG PO CAPS: 40.0000 mg | ORAL_CAPSULE | Freq: Every day | ORAL | 0 refills | Status: DC

## 2019-08-28 MED ORDER — PHENTERMINE HCL 37.5 MG PO CAPS: 37.5000 mg | ORAL_CAPSULE | ORAL | 0 refills | Status: DC

## 2019-08-28 NOTE — Patient Instructions (Addendum)
Try to eat multiple small meals every 2.5 hours or so, eating around 100-120 calories per small meal, using choices that are lower-fat, lower-carb, and higher-protein, such as a Muscle Milk Light, Greek low-fat yogurt, [place] breast roll-ups with a piece of laughing cow cheese or other light cheese, etc.  Behavior Modification Ideas for Weight Management  Weight management involves adopting a healthy lifestyle that includes a knowledge of nutrition and exercise, a positive attitude and the right kind of motivation. Internal motives such as better health, increased energy, self-esteem and personal control increase your chances of lifelong weight management success.  Remember to have realistic goals and think long-term success. Believe in yourself and you can do it. The following information will give you ideas to help you meet your goals.  Control Your Home Environment  Eat only while sitting down at the kitchen or dining room table. Do not eat while watching television, reading, cooking, talking on the phone, standing at the refrigerator or working on the computer. Keep tempting foods out of the house -- don't buy them. Keep tempting foods out of sight. Have low-calorie foods ready to eat. Unless you are preparing a meal, stay out of the kitchen. Have healthy snacks at your disposal, such as small pieces of fruit, vegetables, canned fruit, pretzels, low-fat string cheese and nonfat cottage cheese.  Control Your Work Environment  Do not eat at [employer] or keep tempting snacks at your desk. If you get hungry between meals, plan healthy snacks and bring them with you to work. During your breaks, go for a walk instead of eating. If you work around food, plan in advance the one item you will eat at mealtime. Make it inconvenient to nibble on food by chewing gum, sugarless candy or drinking water or another low-calorie beverage. Do not work through meals. Skipping meals slows down metabolism  and may result in overeating at the next meal. If food is available for special occasions, either pick the healthiest item, nibble on low-fat snacks brought from home, don't have anything offered, choose one option and have a small amount, or have only a beverage.  Control Your Mealtime Environment  Serve your plate of food at the stove or kitchen counter. Do not put the serving dishes on the table. If you do put dishes on the table, remove them immediately when finished eating. Fill half of your plate with vegetables, a quarter with lean protein and a quarter with starch. Use smaller plates, bowls and glasses. A smaller portion will look large when it is in a little dish. Politely refuse second helpings. When fixing your plate, limit portions of food to one scoop/serving or less.   Daily Food Management  Replace eating with another activity that you will not associate with food. Wait 20 minutes before eating something you are craving. Drink a large glass of water or diet soda before eating. Always have a big glass or bottle of water to drink throughout the day. Avoid high-calorie add-ons such as cream with your coffee, butter, mayonnaise and salad dressings.  Shopping: Do not shop when hungry or tired. Shop from a list and avoid buying anything that is not on your list. If you must have tempting foods, buy individual-sized packages and try to find a lower-calorie alternative. Don't taste test in the store. Read food labels. Compare products to help you make the healthiest choices.  Preparation: Chew a piece of gum while cooking meals. Use a quarter teaspoon if you taste test  your food. Try to only fix what you are going to eat, leaving yourself no chance for seconds. If you have prepared more food than you need, portion it into individual containers and freeze or refrigerate immediately. Don't snack while cooking meals.  Eating: Eat slowly. Remember it takes about 20 minutes for  your stomach to send a message to your brain that it is full. Don't let fake hunger make you think you need more. The ideal way to eat is to take a bite, put your utensil down, take a sip of water, cut your next bite, take a bit, put your utensil down and so on. Do not cut your food all at one time. Cut only as needed. Take small bites and chew your food well. Stop eating for a minute or two at least once during a meal or snack. Take breaks to reflect and have conversation.  Cleanup and Leftovers: Label leftovers for a specific meal or snack. Freeze or refrigerate individual portions of leftovers. Do not clean up if you are still hungry.  Eating Out and Social Eating  Do not arrive hungry. Eat something light before the meal. Try to fill up on low-calorie foods, such as vegetables and fruit, and eat smaller portions of the high-calorie foods. Eat foods that you like, but choose small portions. If you want seconds, wait at least 20 minutes after you have eaten to see if you are actually hungry or if your eyes are bigger than your stomach. Limit alcoholic beverages. Try a soda water with a twist of lime. Do not skip other meals in the day to save room for the special event.  At Restaurants: Order  la carte rather than buffet style. Order some vegetables or a salad for an appetizer instead of eating bread. If you order a high-calorie dish, share it with someone. Try an after-dinner mint with your coffee. If you do have dessert, share it with two or more people. Don't overeat because you do not want to waste food. Ask for a doggie bag to take extra food home. Tell the server to put half of your entree in a to go bag before the meal is served to you. Ask for salad dressing, gravy or high-fat sauces on the side. Dip the tip of your fork in the dressing before each bite. If bread is served, ask for only one piece. Try it plain without butter or oil. At [employer] where oil and vinegar  is served with bread, use only a small amount of oil and a lot of vinegar for dipping.  At a Friend[facility] House: Offer to bring a dish, appetizer or dessert that is low in calories. Serve yourself small portions or tell the host that you only want a small amount. Stand or sit away from the snack table. Stay away from the kitchen or stay busy if you are near the food. Limit your alcohol intake.  At [employer] and Cafeterias: Cover most of your plate with lettuce and/or vegetables. Use a salad plate instead of a dinner plate. After eating, clear away your dishes before having coffee or tea.  Entertaining at Home: Explore low-fat, low-cholesterol cookbooks. Use single-serving foods like chicken breasts or hamburger patties. Prepare low-calorie appetizers and desserts.   Holidays: Keep tempting foods out of sight. Decorate the house without using food. Have low-calorie beverages and foods on hand for guests. Allow yourself one planned treat a day. Don't skip meals to save up for the holiday feast. Eat regular,  planned meals.   Exercise Well  Make exercise a priority and a planned activity in the day. If possible, walk the entire or part of the distance to work. Get an exercise buddy. Go for a walk with a colleague during one of your breaks, go to the gym, run or take a walk with a friend, walk in the mall with a shopping companion. Park at the end of the parking lot and walk to the store or office entrance. Always take the stairs all of the way or at least part of the way to your floor. If you have a desk job, walk around the office frequently. Do leg lifts while sitting at your desk. Do something outside on the weekends like going for a hike or a bike ride.   Have a Healthy Attitude  Make health your weight management priority. Be realistic. Have a goal to achieve a healthier you, not necessarily the lowest weight or ideal weight based on calculations or tables. Focus on a healthy  eating style, not on dieting. Dieting usually lasts for a short amount of time and rarely produces long-term success. Think long term. You are developing new healthy behaviors to follow next month, in a year and in a decade.    This information is for educational purposes only and is not intended to replace the advice of your doctor or health care provider. We encourage you to discuss with your doctor any questions or concerns you may have.        Guidelines for Losing Weight   We want weight loss that will last so you should lose 1-2 pounds a week.  THAT IS IT! Please pick THREE things a month to change. Once it is a habit check off the item. Then pick another three items off the list to become habits.  If you are already doing a habit on the list GREAT!  Cross that item off!  Don't drink your calories. Ie, alcohol, soda, fruit juice, and sweet tea.   Drink more water. Drink a glass when you feel hungry or before each meal.   Eat breakfast - Complex carb and protein (likeDannon light and fit yogurt, oatmeal, fruit, eggs, [place] bacon).  Measure your cereal.  Eat no more than one cup a day. (ie Kashi)  Eat an apple a day.  Add a vegetable a day.  Try a new vegetable a month.  Use Pam! Stop using oil or butter to cook.  Don't finish your plate or use smaller plates.  Share your dessert.  Eat sugar free Jello for dessert or frozen grapes.  Don't eat 2-3 hours before bed.  Switch to whole wheat bread, pasta, and brown rice.  Make healthier choices when you eat out. No fries!  Pick baked chicken, NOT fried.  Don't forget to SLOW DOWN when you eat. It is not going anywhere.   Take the stairs.  Park far away in the parking lot  Lift soup cans (or weights) for 10 minutes while watching TV.  Walk at work for 10 minutes during break.  Walk outside 1 time a week with your friend, kids, dog, or significant other.  Start a walking group at church.  Walk the mall as  much as you can tolerate.   Keep a food diary.  Weigh yourself daily.  Walk for 15 minutes 3 days per week.  Cook at home more often and eat out less. If life happens and you go back to old habits, it  is okay.  Just start over. You can do it!  If you experience chest pain, get short of breath, or tired during the exercise, please stop immediately and inform your doctor.    Before you even begin to attack a weight-loss plan, it pays to remember this: You are not fat. You have fat. Losing weight isn't about blame or shame; it's simply another achievement to accomplish. Dieting is like any other skill--you have to buckle down and work at it. As long as you act in a smart, reasonable way, you'll ultimately get where you want to be. Here are some weight loss pearls for you.   1. It's Not a Diet. It's a Lifestyle Thinking of a diet as something you're on and suffering through only for the short term doesn't work. To shed weight and keep it off, you need to make permanent changes to the way you eat. It's OK to indulge occasionally, of course, but if you cut calories temporarily and then revert to your old way of eating, you'll gain back the weight quicker than you can say yo-yo. Use it to lose it. Research shows that one of the best predictors of long-term weight loss is how many pounds you drop in the first month. For that reason, nutritionists often suggest being stricter for the first two weeks of your new eating strategy to build momentum. Cut out added sugar and alcohol and avoid unrefined carbs. After that, figure out how you can reincorporate them in a way that's healthy and maintainable.  2. There's a Right Way to Exercise Working out burns calories and fat and boosts your metabolism by building muscle. But those trying to lose weight are notorious for overestimating the number of calories they burn and underestimating the amount they take in. Unfortunately, your system is biologically  programmed to hold on to extra pounds and that means when you start exercising, your body senses the deficit and ramps up its hunger signals. If you're not diligent, you'll eat everything you burn and then some. Use it, to lose it. Cardio gets all the exercise glory, but strength and interval training are the real heroes. They help you build lean muscle, which in turn increases your metabolism and calorie-burning ability 3. Don't Overreact to Mild Hunger Some people have a hard time losing weight because of hunger anxiety. To them, being hungry is bad--something to be avoided at all costs--so they carry snacks with them and eat when they don't need to. Others eat because they're stressed out or bored. While you never want to get to the point of being ravenous (that's when bingeing is likely to happen), a hunger pang, a craving, or the fact that it's 3:00 p.m. should not send you racing for the vending machine or obsessing about the energy bar in your purse. Ideally, you should put off eating until your stomach is growling and it's difficult to concentrate.  Use it to lose it. When you feel the urge to eat, use the HALT method. Ask yourself, Am I really hungry? Or am I angry or anxious, lonely or bored, or tired? If you're still not certain, try the apple test. If you're truly hungry, an apple should seem delicious; if it doesn't, something else is going on. Or you can try drinking water and making yourself busy, if you are still hungry try a healthy snack.  4. Not All Calories Are Created Equal The mechanics of weight loss are pretty simple: Take in fewer calories than you  use for energy. But the kind of food you eat makes all the difference. Processed food that's high in saturated fat and refined starch or sugar can cause inflammation that disrupts the hormone signals that tell your brain you're full. The result: You eat a lot more.  Use it to lose it. Clean up your diet. Swap in whole, unprocessed foods,  including vegetables, lean protein, and healthy fats that will fill you up and give you the biggest nutritional bang for your calorie buck. In a few weeks, as your brain starts receiving regular hunger and fullness signals once again, you'll notice that you feel less hungry overall and naturally start cutting back on the amount you eat.  5. Protein, Produce, and Plant-Based Fats Are Your Weight-Loss Trinity Here's why eating the three Ps regularly will help you drop pounds. Protein fills you up. You need it to build lean muscle, which keeps your metabolism humming so that you can torch more fat. People in a weight-loss program who ate double the recommended daily allowance for protein (about 110 grams for a 150-pound woman) lost 70 percent of their weight from fat, while people who ate the RDA lost only about 40 percent, one study found. Produce is packed with filling fiber. "It's very difficult to consume too many calories if you're eating a lot of vegetables. Example: Three cups of broccoli is a lot of food, yet only 93 calories. (Fruit is another story. It can be easy to overeat and can contain a lot of calories from sugar, so be sure to monitor your intake.) Plant-based fats like olive oil and those in avocados and nuts are healthy and extra satiating.  Use it to lose it. Aim to incorporate each of the three Ps into every meal and snack. People who eat protein throughout the day are able to keep weight off, according to a study in the American Journal of Clinical Nutrition. In addition to meat, poultry and seafood, good sources are beans, lentils, eggs, tofu, and yogurt. As for fat, keep portion sizes in check by measuring out salad dressing, oil, and nut butters (shoot for one to two tablespoons). Finally, eat veggies or a little fruit at every meal. People who did that consumed 308 fewer calories but didn't feel any hungrier than when they didn't eat more produce.  7. How You Eat Is As Important As  What You Eat In order for your brain to register that you're full, you need to focus on what you're eating. Sit down whenever you eat, preferably at a table. Turn off the TV or computer, put down your phone, and look at your food. Smell it. Chew slowly, and don't put another bite on your fork until you swallow. When women ate lunch this attentively, they consumed 30 percent less when snacking later than those who listened to an audiobook at lunchtime, according to a study in the [place] Journal of Nutrition. 8. Weighing Yourself Really Works The scale provides the best evidence about whether your efforts are paying off. Seeing the numbers tick up or down or stagnate is motivation to keep going--or to rethink your approach. A 2015 study at [facility] found that daily weigh-ins helped people lose more weight, keep it off, and maintain that loss, even after two years. Use it to lose it. Step on the scale at the same time every day for the best results. If your weight shoots up several pounds from one weigh-in to the next, don't freak out. Eating a  lot of salt the night before or having your period is the likely culprit. The number should return to normal in a day or two. It's a steady climb that you need to do something about. 9. Too Much Stress and Too Little Sleep Are Your Enemies When you're tired and frazzled, your body cranks up the production of cortisol, the stress hormone that can cause carb cravings. Not getting enough sleep also boosts your levels of ghrelin, a hormone associated with hunger, while suppressing leptin, a hormone that signals fullness and satiety. People on a diet who slept only five and a half hours a night for two weeks lost 55 percent less fat and were hungrier than those who slept eight and a half hours, according to a study in the [place] [employer] Journal. Use it to lose it. Prioritize sleep, aiming for seven hours or more a night, which research shows helps  lower stress. And make sure you're getting quality zzz's. If a snoring spouse or a fidgety cat wakes you up frequently throughout the night, you may end up getting the equivalent of just four hours of sleep, according to a study from [facility]. Keep pets out of the bedroom, and use a white-noise app to drown out snoring. 10. You Will Hit a plateau--And You Can Bust Through It As you slim down, your body releases much less leptin, the fullness hormone.  If you're not strength training, start right now. Building muscle can raise your metabolism to help you overcome a plateau. To keep your body challenged and burning calories, incorporate new moves and more intense intervals into your workouts or add another sweat session to your weekly routine. Alternatively, cut an extra 100 calories or so a day from your diet. Now that you've lost weight, your body simply doesn't need as much fuel.    Since food equals calories, in order to lose weight you must either eat fewer calories, exercise more to burn off calories with activity, or both. Food that is not used to fuel the body is stored as fat. A major component of losing weight is to make smarter food choices. Here's how:  1)   Limit non-nutritious foods, such as: Sugar, honey, syrups and candy Pastries, donuts, pies, cakes and cookies Soft drinks, sweetened juices and alcoholic beverages  2)  Cut down on high-fat foods by: - Choosing poultry, fish or lean red meat - Choosing low-fat cooking methods, such as baking, broiling, steaming, grilling and boiling - Using low-fat or non-fat dairy products - Using vinaigrette, herbs, lemon or fat-free salad dressings - Avoiding fatty meats, such as bacon, sausage, franks, ribs and luncheon meats - Avoiding high-fat snacks like nuts, chips and chocolate - Avoiding fried foods - Using less butter, margarine, oil and mayonnaise - Avoiding high-fat gravies, cream sauces and cream-based soups  3) Eat a  variety of foods, including: - Fruit and vegetables that are raw, steamed or baked - Whole grains, breads, cereal, rice and pasta - Dairy products, such as low-fat or non-fat milk or yogurt, low-fat cottage cheese and low-fat cheese - Protein-rich foods like chicken, [place], fish, lean meat and legumes, or beans  4) Change your eating habits by: - Eat three balanced meals a day to help control your hunger - Watch portion sizes and eat small servings of a variety of foods - Choose low-calorie snacks - Eat only when you are hungry and stop when you are satisfied - Eat slowly and try not to perform  other tasks while eating - Find other activities to distract you from food, such as walking, taking up a hobby or being involved in the community - Include regular exercise in your daily routine ( minimum of 20 min of moderate-intensity exercise at least 5 days/week)  - Find a support group, if necessary, for emotional support in your weight loss journey           Easy ways to cut 100 calories   1. Eat your eggs with hot sauce OR salsa instead of cheese.  Eggs are great for breakfast, but many people consider eggs and cheese to be BFFs. Instead of cheese--1 oz. of cheddar has 114 calories--top your eggs with hot sauce, which contains no calories and helps with satiety and metabolism. Salsa is also a great option!!  2. Top your toast, waffles or pancakes with fresh berries instead of jelly or syrup. Half a cup of berries--fresh, frozen or thawed--has about 40 calories, compared with 2 tbsp. of maple syrup or jelly, which both have about 100 calories. The berries will also give you a good punch of fiber, which helps keep you full and satisfied and won't spike blood sugar quickly like the jelly or syrup. 3. Swap the non-fat latte for black coffee with a splash of half-and-half. Contrary to its name, that non-fat latte has 130 calories and a startling 19g of carbohydrates per 16 oz. serving.  Replacing that 'light' drinkable dessert with a black coffee with a splash of half-and-half saves you more than 100 calories per 16 oz. serving. 4. Sprinkle salads with freeze-dried raspberries instead of dried cranberries. If you want a sweet addition to your nutritious salad, stay away from dried cranberries. They have a whopping 130 calories per  cup and 30g carbohydrates. Instead, sprinkle freeze-dried raspberries guilt-free and save more than 100 calories per  cup serving, adding 3g of belly-filling fiber. 5. Go for mustard in place of mayo on your sandwich. Mustard can add really nice flavor to any sandwich, and there are tons of varieties, from spicy to honey. A serving of mayo is 95 calories, versus 10 calories in a serving of mustard.  Or try an avocado mayo spread: You can find the recipe few click this link: https://www.californiaavocado.com/recipes/recipe-container/california-avocado-mayo 6. Choose a DIY salad dressing instead of the store-bought kind. Mix Dijon or whole grain mustard with low-fat Kefir or red wine vinegar and garlic. 7. Use hummus as a spread instead of a dip. Use hummus as a spread on a high-fiber cracker or tortilla with a sandwich and save on calories without sacrificing taste. 8. Pick just one salad "accessory." Salad isn't automatically a calorie winner. It's easy to over-accessorize with toppings. Instead of topping your salad with nuts, avocado and cranberries (all three will clock in at 313 calories), just pick one. The next day, choose a different accessory, which will also keep your salad interesting. You don't wear all your jewelry every day, right? 9. Ditch the white pasta in favor of spaghetti squash. One cup of cooked spaghetti squash has about 40 calories, compared with traditional spaghetti, which comes with more than 200. Spaghetti squash is also nutrient-dense. It's a good source of fiber and Vitamins A and C, and it can be eaten just like you would eat  pasta--with a great tomato sauce and [place] meatballs or with pesto, tofu and spinach, for example. 10. Dress up your chili, soups and stews with non-fat Greek yogurt instead of sour cream. Just a 'dollop' of sour cream  can set you back 115 calories and a whopping 12g of fat--seven of which are of the artery-clogging variety. Added bonus: [place] yogurt is packed with muscle-building protein, calcium and B Vitamins. 11. Mash cauliflower instead of mashed potatoes. One cup of traditional mashed potatoes--in all their creamy goodness--has more than 200 calories, compared to mashed cauliflower, which you can typically eat for less than 100 calories per 1 cup serving. Cauliflower is a great source of the antioxidant indole-3-carbinol (I3C), which may help reduce the risk of some cancers, like breast cancer. 12. Ditch the ice cream sundae in favor of a [place] parfait. Instead of a cup of ice cream or fro-yo for dessert, try 1 cup of nonfat Greek yogurt topped with fresh berries and a sprinkle of cacao nibs. Both toppings are packed with antioxidants, which can help reduce cellular inflammation and oxidative damage. And the comparison is a no-brainer: One cup of ice cream has about 275 calories; one cup of frozen yogurt has about 230; and a cup of Greek yogurt has just 130, plus twice the protein, so you're less likely to return to the freezer for a second helping. 13. Put olive oil in a spray container instead of using it directly from the bottle. Each tablespoon of olive oil is 120 calories and 15g of fat. Use a mister instead of pouring it straight into the pan or onto a salad. This allows for portion control and will save you more than 100 calories. 14. When baking, substitute canned pumpkin for butter or oil. Canned pumpkin--not pumpkin pie mix--is loaded with Vitamin A, which is important for skin and eye health, as well as immunity. And the comparisons are pretty crazy:  cup of canned pumpkin has  about 40 calories, compared to butter or oil, which has more than 800 calories. Yes, 800 calories. Applesauce and mashed banana can also serve as good substitutions for butter or oil, usually in a 1:1 ratio. 15. Top casseroles with high-fiber cereal instead of breadcrumbs. Breadcrumbs are typically made with white bread, while breakfast cereals contain 5-9g of fiber per serving. Not only will you save more than 150 calories per  cup serving, the swap will also keep you more full and you'll get a metabolism boost from the added fiber. 16. Snack on pistachios instead of macadamia nuts. Believe it or not, you get the same amount of calories from 35 pistachios (100 calories) as you would from only five macadamia nuts. 17. Chow down on kale chips rather than potato chips. This is my favorite 'don't knock it 'till you try it' swap. Kale chips are so easy to make at home, and you can spice them up with a little grated parmesan or chili powder. Plus, they're a mere fraction of the calories of potato chips, but with the same crunch factor we crave so often. 18. Add seltzer and some fruit slices to your cocktail instead of soda or fruit juice. One cup of soda or fruit juice can pack on as much as 140 calories. Instead, use seltzer and fruit slices. The fruit provides valuable phytochemicals, such as flavonoids and anthocyanins, which help to combat cancer and stave off the aging process.

## 2019-08-28 NOTE — Progress Notes (Signed)
Impression and Recommendations:    1. BMI 33.0-33.9,adult   2. GAD (generalized anxiety disorder)   3. Abnormal weight gain    Of note, this is my first time meeting patient.  Patient is new to me and was previously being cared for at our office by an NP, who no longer works at [facility].   Will be seen by Mayer Masker, PA-C in future.    - Patient was last seen in office during OV on 02/14/2019.   - Patient was started on phentermine for assistance with weight loss on 01/15/2019.  Her starting weight was 200 lbs; she lost 8 lbs in first month.  Patient was told to follow up again, but was lost to follow-up.   Weight Loss & BMI Counseling - BMI is 33.04 kg/m Explained to patient what BMI refers to, and what it means medically.  Told patient to think about it as a "medical risk stratification measurement" and how increasing BMI is associated with increasing risk/ or worsening state of various diseases such as hypertension, hyperlipidemia, diabetes, premature OA, depression etc.  - Education provided to patient today regarding nature of phentermine as a high-risk medication meant for short-term use.  Discussed importance of cultivating prudent dietary habits to promote healthy weight without prescription assistance.  - Recommended avoiding use of phentermine in the future and instead working on weight loss through lifestyle management.  Once pt has a healthy diet down, and loses 5% of her wt in 4 wks on her own, then consider adding meds to AUGMENT wt loss.  However, pt insists on med RF today as her and previous PCP discussed, thus given #30 d only and she must lose 5% wt to gain second month of meds  - Encouraged patient to engage in mindfulness about her eating habits, examining what she is eating and why she is eating.  Recommended resources such as self-help books and meditation.  - Counseling provided to patient today regarding healthier eating habits.   American Heart Association guidelines for healthy diet, basically Mediterranean diet, and exercise guidelines of 30 minutes 5 days per week or more discussed in detail.  - Goal is 8 lbs weight loss in 4 weeks.  - Extensive health counseling performed.  I spent 25+ minutes of face-to-face and non-face-to-face time with patient.   Time additionally spent on pre-visit chart review, lab review, study review, order entry, electronic health record documentation review.   All questions answered.   GAD - Stable at this time. - Continue management as established.  See med list. - Refill provided.  Patient will continue follow-up as planned or sooner if problems. - Will continue to monitor.   Lifestyle & Preventative Health Maintenance - Advised patient to continue working toward exercising to improve overall mental, physical, and emotional health.    - Reviewed the "spokes of the wheel" of mood and wellbeing.  Stressed the importance of ongoing prudent habits, including regular exercise, appropriate sleep hygiene, healthful dietary habits, and prayer/meditation to relax.  - Encouraged patient to engage in daily physical activity as tolerated, especially a formal exercise routine.  Recommended that the patient eventually strive for at least 150 minutes of moderate cardiovascular activity per week according to guidelines established by the [facility].   - Healthy dietary habits encouraged, including low-carb, and high amounts of lean protein in diet.   - Patient should also consume adequate amounts of water.   Meds ordered this encounter  Medications  .  FLUoxetine (PROZAC) 40 MG capsule    Sig: Take 1 capsule (40 mg total) by mouth daily.    Dispense:  90 capsule    Refill:  0  . phentermine 37.5 MG capsule    Sig: Take 1 capsule (37.5 mg total) by mouth every morning. (appt needed 4 RF)    Dispense:  30 capsule    Refill:  0    Medications Discontinued During This Encounter  Medication Reason    . phentermine 37.5 MG capsule Reorder  . FLUoxetine (PROZAC) 40 MG capsule Reorder     Please see AVS handed out to patient at the end of our visit for further patient instructions/ counseling done pertaining to today's office visit.   Return for f/up 4 weeks for weight loss check-up, blood pressure check, (goal is 8lbs wt loss) etc..     Note:  This note was prepared with assistance of Dragon voice recognition software. Occasional wrong-word or sound-a-like substitutions may have occurred due to the inherent limitations of voice recognition software.   The [place] was signed into law in 2016 which includes the topic of electronic health records.  This provides immediate access to information in MyChart.  This includes consultation notes, operative notes, office notes, lab results and pathology reports.  If you have any questions about what you read please let us know at your next visit or call us at the office.  We are right here with you.   This case required medical decision making of at least moderate complexity.  This document serves as a record of services personally performed by Mellody Dance, DO. It was created on her behalf by Toni Amend, a trained medical scribe. The creation of this record is based on the scribe's personal observations and the provider's statements to them.    The above documentation from Toni Amend, medical scribe, has been reviewed by Marjory Sneddon, D.O.   --------------------------------------------------------------------------------------------------------------------------------------------------------------------------------------------------------------------------------------------  Subjective:     Phillips Odor, am serving as scribe for Dr.Siham Bucaro.   HPI: Kelsey Ayers is a 24 y.o. female who presents to [place] at [facility] today for issues as discussed below.  Notes she's  doing well today.  - Weight Loss Goals Says she's tried to lose weight for a handful of years, and "it just seemed like nothing was kicking in and doing something."  She was going to the gym before COVID-19, working out, etc.  Now, instead of the gym, she does yard work, walking, "all kinds of other stuff at home, other exercises and all."    She wanted to try phentermine because she had friends who used it to [employer] their weight loss."  Her sister and some friends have been using [place].  Notes she has wanted to try it, but since the patient is single, lives on her own, and has to pay for everything herself, Linus Salmons is cost-prohibitive to her.  Says she doesn't feel like she eats a ton of food, but "it depends."  - GAD Patient requests refill of fluoxetine today.    Wt Readings from Last 3 Encounters:  08/28/19 193 lb 8 oz (87.8 kg)  02/14/19 192 lb 11.2 oz (87.4 kg)  01/15/19 200 lb 6.4 oz (90.9 kg)   BP Readings from Last 3 Encounters:  08/28/19 125/80  02/14/19 112/76  01/15/19 113/68   Pulse Readings from Last 3 Encounters:  08/28/19 77  02/14/19 (!) 102  01/15/19 69   BMI  Readings from Last 3 Encounters:  08/28/19 33.04 kg/m  02/14/19 32.57 kg/m  01/15/19 33.87 kg/m     Patient Care Team    Relationship Specialty Notifications Start End  Julaine Fusi, NP PCP - General Family Medicine  05/04/17   Alena Bills, MD (Inactive) Consulting Physician Pediatrics  05/04/17      Patient Active Problem List   Diagnosis Date Noted  . Abnormal weight gain 03/28/2019  . BMI 33.0-33.9,adult 07/10/2018  . GAD (generalized anxiety disorder) 06/06/2018  . Abdominal pain, acute 01/23/2018  . Pharyngitis due to Streptococcus species 09/14/2017  . Healthcare maintenance 05/04/2017  . Seasonal allergies 05/04/2017  . Cough 05/04/2017    Past Medical history, Surgical history, Family history, Social history, Allergies and Medications have been entered into the  medical record, reviewed and changed as needed.    Current Meds  Medication Sig  . FLUoxetine (PROZAC) 40 MG capsule Take 1 capsule (40 mg total) by mouth daily.  . Norethindrone Acetate-Ethinyl Estrad-FE (BLISOVI 24 FE) 1-20 MG-MCG(24) tablet Take 1 tablet by mouth daily.  . [DISCONTINUED] FLUoxetine (PROZAC) 40 MG capsule Take 1 capsule (40 mg total) by mouth daily. **PATIENT NEEDS APT FOR FURTHER REFILLS**    Allergies:  Allergies  Allergen Reactions  . Other Itching    Cats, dogs     Review of Systems:  A fourteen system review of systems was performed and found to be positive as per HPI.   Objective:   Blood pressure 125/80, pulse 77, temperature 98.8 F (37.1 C), temperature source Oral, resp. rate 10, height 5' 4.17" (1.63 m), weight 193 lb 8 oz (87.8 kg), last menstrual period 08/16/2019, SpO2 97 %. Body mass index is 33.04 kg/m.   General: Well Developed, well nourished, and in no acute distress.  HEENT: Normocephalic, atraumatic Skin: Warm and dry, cap RF less 2 sec, good turgor Chest:  Normal excursion, shape, no gross abn Respiratory: speaking in full sentences, no conversational dyspnea NeuroM-Sk: Ambulates w/o assistance, moves * 4 Psych: A and O *3, insight good, mood-full

## 2019-09-25 NOTE — Progress Notes (Addendum)
Established Patient Office Visit  Subjective:  Patient ID: Kelsey Ayers, female    DOB: 1996-02-09  Age: 24 y.o. MRN: [id number]  CC:  Chief Complaint  Patient presents with  . Weight Check  . Anxiety  . Depression    HPI Kelsey Ayers presents for follow-up on weight loss. Pt was unable to tolerate Phentermine and discontinued it after about 1 week. Reports medication was worsening her anxiety. Pt states she has experienced increased stress from work and personal life for the past month. Pt is trying to make healthier eating choices and she likes to go for walks which she hasn't been able to do much recently due to the weather and has been busy with work. She would like to try something else to help with weight loss.   Past Medical History:  Diagnosis Date  . Allergy     Past Surgical History:  Procedure Laterality Date  . FOOT SURGERY Right   . WISDOM TOOTH EXTRACTION      Family History  Problem Relation Age of Onset  . Hypertension Father   . Diabetes Sister     Social History   Socioeconomic History  . Marital status: Single    Spouse name: Not on file  . Number of children: Not on file  . Years of education: Not on file  . Highest education level: Not on file  Occupational History  . Not on file  Tobacco Use  . Smoking status: Never Smoker  . Smokeless tobacco: Never Used  Substance and Sexual Activity  . Alcohol use: Yes    Alcohol/week: 1.0 standard drinks    Types: 1 Glasses of wine per week  . Drug use: No  . Sexual activity: Never    Birth control/protection: None  Other Topics Concern  . Not on file  Social History Narrative  . Not on file   Social Determinants of Health   Financial Resource Strain:   . Difficulty of Paying Living Expenses:   Food Insecurity:   . Worried About Charity fundraiser in the Last Year:   . Arboriculturist in the Last Year:   Transportation Needs:   . Film/video editor (Medical):   Marland Kitchen Lack of  Transportation (Non-Medical):   Physical Activity:   . Days of Exercise per Week:   . Minutes of Exercise per Session:   Stress:   . Feeling of Stress :   Social Connections:   . Frequency of Communication with Friends and Family:   . Frequency of Social Gatherings with Friends and Family:   . Attends Religious Services:   . Active Member of Clubs or Organizations:   . Attends Archivist Meetings:   Marland Kitchen Marital Status:   Intimate Partner Violence:   . Fear of Current or Ex-Partner:   . Emotionally Abused:   Marland Kitchen Physically Abused:   . Sexually Abused:     Outpatient Medications Prior to Visit  Medication Sig Dispense Refill  . FLUoxetine (PROZAC) 40 MG capsule Take 1 capsule (40 mg total) by mouth daily. 90 capsule 0  . Norethindrone Acetate-Ethinyl Estrad-FE (BLISOVI 24 FE) 1-20 MG-MCG(24) tablet Take 1 tablet by mouth daily.    . phentermine 37.5 MG capsule Take 1 capsule (37.5 mg total) by mouth every morning. (appt needed 4 RF) (Patient not taking: Reported on 09/26/2019) 30 capsule 0   No facility-administered medications prior to visit.    Allergies  Allergen Reactions  . Other  Itching    Cats, dogs    ROS Review of Systems:  A fourteen system review of systems was performed and found to be positive as per HPI.  Objective:    Physical Exam  General: Well nourished, in no apparent distress. Eyes: PERRLA, EOMs, conjunctiva clr Resp: Respiratory effort- normal, ECTA B/L w/o W/R/R  Cardio: RRR w/o MRGs. Abdomen: no gross distention. Lymphatics:  less 2 sec cap RF M-sk: Full ROM, 5/5 strength, normal gait.  Skin: Warm, dry  Neuro: Alert, Oriented Psych: Normal affect, Insight and Judgment appropriate.   BP 123/76   Pulse 81   Temp 97.8 F (36.6 C) (Oral)   Ht 5' 4.57" (1.64 m)   Wt 198 lb 11.2 oz (90.1 kg)   LMP  (LMP Unknown)   SpO2 96%   BMI 33.51 kg/m  Wt Readings from Last 3 Encounters:  09/26/19 198 lb 11.2 oz (90.1 kg)  08/28/19 193 lb 8 oz  (87.8 kg)  02/14/19 192 lb 11.2 oz (87.4 kg)     Health Maintenance Due  Topic Date Due  . COVID-19 Vaccine (1) Never done  . PAP SMEAR-Modifier  Never done    There are no preventive care reminders to display for this patient.  Lab Results  Component Value Date   TSH 2.830 01/24/2019   Lab Results  Component Value Date   WBC 8.3 01/24/2019   HGB 13.5 01/24/2019   HCT 42.0 01/24/2019   MCV 86 01/24/2019   PLT 317 01/24/2019   Lab Results  Component Value Date   NA 137 01/24/2019   K 4.4 01/24/2019   CO2 22 01/24/2019   GLUCOSE 86 01/24/2019   BUN 8 01/24/2019   CREATININE 0.69 01/24/2019   BILITOT 0.2 01/24/2019   ALKPHOS 68 01/24/2019   AST 17 01/24/2019   ALT 13 01/24/2019   PROT 6.9 01/24/2019   ALBUMIN 4.2 01/24/2019   CALCIUM 9.1 01/24/2019   Lab Results  Component Value Date   CHOL 169 01/24/2019   Lab Results  Component Value Date   HDL 50 01/24/2019   Lab Results  Component Value Date   LDLCALC 101 (H) 01/24/2019   Lab Results  Component Value Date   TRIG 99 01/24/2019   Lab Results  Component Value Date   CHOLHDL 3.4 01/24/2019   Lab Results  Component Value Date   HGBA1C 5.2 01/24/2019      Assessment & Plan:   Problem List Items Addressed This Visit      Other   GAD (generalized anxiety disorder)   Relevant Medications   buPROPion (WELLBUTRIN SR) 100 MG 12 hr tablet   BMI 33.0-33.9,adult - Primary   Relevant Medications   buPROPion (WELLBUTRIN SR) 100 MG 12 hr tablet   Abnormal weight gain   Relevant Medications   buPROPion (WELLBUTRIN SR) 100 MG 12 hr tablet    Other Visit Diagnoses    Encounter for weight loss counseling         BMI 33.0-33.9, Weight loss management: - Pt denies history of seizures, gained 5 lbs,  and has worsening anxiety, PHQ9 score of 9 and GAD-7 score of 4 so will start Wellbutrin to help with weight loss and improve mood. - Encouraged patient to continue dietary changes and increase physical  activity. - Encouraged to reduce comfort foods to cope with increased stress. - Follow-up in 4 weeks to reassess weight and medication therapy.  GAD: - Continue Prozac 40 mg - Will continue to monitor  Meds ordered this encounter  Medications  . buPROPion (WELLBUTRIN SR) 100 MG 12 hr tablet    Sig: Take 1 tablet (100 mg total) by mouth 2 (two) times daily.    Dispense:  60 tablet    Refill:  0    Order Specific Question:   Supervising Provider    Answer:   Nani Gasser D [[id number]]    Follow-up: Return in about 4 weeks (around 10/24/2019) for Weight management.    Mayer Masker, PA-C

## 2019-09-26 ENCOUNTER — Other Ambulatory Visit: Payer: Self-pay

## 2019-09-26 ENCOUNTER — Ambulatory Visit (INDEPENDENT_AMBULATORY_CARE_PROVIDER_SITE_OTHER): Payer: 59 | Admitting: Physician Assistant

## 2019-09-26 ENCOUNTER — Encounter: Payer: Self-pay | Admitting: Physician Assistant

## 2019-09-26 VITALS — BP 123/76 | HR 81 | Temp 97.8°F | Ht 64.57 in | Wt 198.7 lb

## 2019-09-26 DIAGNOSIS — Z713 Dietary counseling and surveillance: Secondary | ICD-10-CM | POA: Diagnosis not present

## 2019-09-26 DIAGNOSIS — F411 Generalized anxiety disorder: Secondary | ICD-10-CM | POA: Diagnosis not present

## 2019-09-26 DIAGNOSIS — R635 Abnormal weight gain: Secondary | ICD-10-CM

## 2019-09-26 DIAGNOSIS — Z6833 Body mass index (BMI) 33.0-33.9, adult: Secondary | ICD-10-CM

## 2019-09-26 MED ORDER — BUPROPION HCL ER (SR) 100 MG PO TB12: 100.0000 mg | ORAL_TABLET | Freq: Two times a day (BID) | ORAL | 0 refills | Status: DC

## 2019-09-26 NOTE — Patient Instructions (Signed)

## 2019-10-27 NOTE — Progress Notes (Signed)
Established Patient Office Visit  Subjective:  Patient ID: Kelsey Ayers, female    DOB: [birth year]  Age: 24 y.o. MRN: [id number]  CC:  Chief Complaint  Patient presents with  . Weight Loss    HPI Kelsey Ayers presents for 4 week follow-up on weight management. She didn't tolerate Phentermine and was switched to Wellbutrin last OV. Reports she has tolerated medication without side effects but doesn't feel like it suppresses her appetite. Sometimes she did miss the second dose of Wellbutrin. She recently returned from a beach trip and wasn't as prudent with making healthier meal choices. She has worked on changing her diet by decreasing snacks and sweets. She has tried MyFitnessPal app in the past but was difficult to keep up with due to exact measurements. She likes to exercise with outdoor activities such as walking or Conservator, museum/gallery. States Wellbutrin has helped with her anxiety and doesn't feel as anxious as before.  Past Medical History:  Diagnosis Date  . Allergy     Past Surgical History:  Procedure Laterality Date  . FOOT SURGERY Right   . WISDOM TOOTH EXTRACTION      Family History  Problem Relation Age of Onset  . Hypertension Father   . Diabetes Sister     Social History   Socioeconomic History  . Marital status: Single    Spouse name: Not on file  . Number of children: Not on file  . Years of education: Not on file  . Highest education level: Not on file  Occupational History  . Not on file  Tobacco Use  . Smoking status: Never Smoker  . Smokeless tobacco: Never Used  Vaping Use  . Vaping Use: Never used  Substance and Sexual Activity  . Alcohol use: Yes    Alcohol/week: 1.0 standard drink    Types: 1 Glasses of wine per week  . Drug use: No  . Sexual activity: Never    Birth control/protection: None  Other Topics Concern  . Not on file  Social History Narrative  . Not on file   Social Determinants of Health   Financial Resource Strain:    . Difficulty of Paying Living Expenses:   Food Insecurity:   . Worried About Programme researcher, broadcasting/film/video in the Last Year:   . Barista in the Last Year:   Transportation Needs:   . Freight forwarder (Medical):   Marland Kitchen Lack of Transportation (Non-Medical):   Physical Activity:   . Days of Exercise per Week:   . Minutes of Exercise per Session:   Stress:   . Feeling of Stress :   Social Connections:   . Frequency of Communication with Friends and Family:   . Frequency of Social Gatherings with Friends and Family:   . Attends Religious Services:   . Active Member of Clubs or Organizations:   . Attends Banker Meetings:   Marland Kitchen Marital Status:   Intimate Partner Violence:   . Fear of Current or Ex-Partner:   . Emotionally Abused:   Marland Kitchen Physically Abused:   . Sexually Abused:     Outpatient Medications Prior to Visit  Medication Sig Dispense Refill  . FLUoxetine (PROZAC) 40 MG capsule Take 1 capsule (40 mg total) by mouth daily. 90 capsule 0  . Norethindrone Acetate-Ethinyl Estrad-FE (BLISOVI 24 FE) 1-20 MG-MCG(24) tablet Take 1 tablet by mouth daily.    . phentermine 37.5 MG capsule Take 1 capsule (37.5 mg total) by  mouth every morning. (appt needed 4 RF) 30 capsule 0  . buPROPion (WELLBUTRIN SR) 100 MG 12 hr tablet Take 1 tablet (100 mg total) by mouth 2 (two) times daily. 60 tablet 0   No facility-administered medications prior to visit.    Allergies  Allergen Reactions  . Other Itching    Cats, dogs    ROS Review of Systems  A fourteen system review of systems was performed and found to be positive as per HPI.    Objective:    Physical Exam General:  Well Developed, well nourished, appropriate for stated age.  Neuro:  Alert and oriented,  extra-ocular muscles intact  HEENT:  Normocephalic, atraumatic, neck supple, no carotid bruits appreciated  Skin:  no gross rash, warm, pink. Cardiac:  RRR, S1 S2 Respiratory:  ECTA B/L and A/P, Not using accessory  muscles, speaking in full sentences- unlabored. Vascular:  Ext warm, no cyanosis apprec. No edema noted. Psych:  No HI/SI, judgement and insight good, Euthymic mood. Full Affect.  BP 110/69   Pulse 65   Temp 98.4 F (36.9 C) (Oral)   Ht 5' 4.5" (1.638 m)   Wt 201 lb 6.4 oz (91.4 kg)   LMP 10/08/2019 (Exact Date)   SpO2 98% Comment: on RA  BMI 34.04 kg/m  Wt Readings from Last 3 Encounters:  10/28/19 201 lb 6.4 oz (91.4 kg)  09/26/19 198 lb 11.2 oz (90.1 kg)  08/28/19 193 lb 8 oz (87.8 kg)     Health Maintenance Due  Topic Date Due  . Hepatitis C Screening  Never done  . COVID-19 Vaccine (1) Never done  . HIV Screening  Never done  . TETANUS/TDAP  Never done  . PAP-Cervical Cytology Screening  Never done  . PAP SMEAR-Modifier  Never done    There are no preventive care reminders to display for this patient.  Lab Results  Component Value Date   TSH 2.830 01/24/2019   Lab Results  Component Value Date   WBC 8.3 01/24/2019   HGB 13.5 01/24/2019   HCT 42.0 01/24/2019   MCV 86 01/24/2019   PLT 317 01/24/2019   Lab Results  Component Value Date   NA 137 01/24/2019   K 4.4 01/24/2019   CO2 22 01/24/2019   GLUCOSE 86 01/24/2019   BUN 8 01/24/2019   CREATININE 0.69 01/24/2019   BILITOT 0.2 01/24/2019   ALKPHOS 68 01/24/2019   AST 17 01/24/2019   ALT 13 01/24/2019   PROT 6.9 01/24/2019   ALBUMIN 4.2 01/24/2019   CALCIUM 9.1 01/24/2019   Lab Results  Component Value Date   CHOL 169 01/24/2019   Lab Results  Component Value Date   HDL 50 01/24/2019   Lab Results  Component Value Date   LDLCALC 101 (H) 01/24/2019   Lab Results  Component Value Date   TRIG 99 01/24/2019   Lab Results  Component Value Date   CHOLHDL 3.4 01/24/2019   Lab Results  Component Value Date   HGBA1C 5.2 01/24/2019      Assessment & Plan:   Problem List Items Addressed This Visit      Other   GAD (generalized anxiety disorder) - Primary    - Improved and stable. -  Continue Prozac 40 mg and Wellbutrin dose was adjusted. - Encourage to continue non-pharmacologic therapy such as good sleep hygiene and exercise. - Will continue to monitor.       Relevant Medications   buPROPion (WELLBUTRIN XL) 300 MG 24  hr tablet   BMI 33.0-33.9,adult    - Pt has gained weight and is agreeable to increasing dose of Wellbutrin to 300 mg once daily.  - Encouraged to use Lose It app. - Encouraged to continue dietary changes and physical activity with ultimate goal of 150 minutes/week. - Advised to selective healthier snack choices.        Other Visit Diagnoses    Encounter for weight loss counseling       Relevant Medications   buPROPion (WELLBUTRIN XL) 300 MG 24 hr tablet      Meds ordered this encounter  Medications  . buPROPion (WELLBUTRIN XL) 300 MG 24 hr tablet    Sig: Take 1 tablet (300 mg total) by mouth daily.    Dispense:  30 tablet    Refill:  2    Order Specific Question:   Supervising Provider    Answer:   Nani Gasser D [[id number]]    Follow-up: Return for Weight, GAD in 8 weeks.    Mayer Masker, PA-C

## 2019-10-28 ENCOUNTER — Other Ambulatory Visit: Payer: Self-pay

## 2019-10-28 ENCOUNTER — Encounter: Payer: Self-pay | Admitting: Physician Assistant

## 2019-10-28 ENCOUNTER — Ambulatory Visit (INDEPENDENT_AMBULATORY_CARE_PROVIDER_SITE_OTHER): Payer: 59 | Admitting: Physician Assistant

## 2019-10-28 VITALS — BP 110/69 | HR 65 | Temp 98.4°F | Ht 64.5 in | Wt 201.4 lb

## 2019-10-28 DIAGNOSIS — Z713 Dietary counseling and surveillance: Secondary | ICD-10-CM

## 2019-10-28 DIAGNOSIS — F411 Generalized anxiety disorder: Secondary | ICD-10-CM

## 2019-10-28 DIAGNOSIS — Z6834 Body mass index (BMI) 34.0-34.9, adult: Secondary | ICD-10-CM | POA: Diagnosis not present

## 2019-10-28 MED ORDER — BUPROPION HCL ER (XL) 300 MG PO TB24: 300.0000 mg | ORAL_TABLET | Freq: Every day | ORAL | 2 refills | Status: DC

## 2019-10-28 NOTE — Assessment & Plan Note (Addendum)
-   Pt has gained weight and is agreeable to increasing dose of Wellbutrin to 300 mg once daily.  - Encouraged to use Lose It app. - Encouraged to continue dietary changes and physical activity with ultimate goal of 150 minutes/week. - Advised to selective healthier snack choices.

## 2019-10-28 NOTE — Assessment & Plan Note (Signed)
-   Improved and stable. - Continue Prozac 40 mg and Wellbutrin dose was adjusted. - Encourage to continue non-pharmacologic therapy such as good sleep hygiene and exercise. - Will continue to monitor.

## 2019-10-28 NOTE — Patient Instructions (Signed)

## 2019-11-12 ENCOUNTER — Telehealth: Payer: 59 | Admitting: Emergency Medicine

## 2019-11-12 DIAGNOSIS — J069 Acute upper respiratory infection, unspecified: Secondary | ICD-10-CM | POA: Diagnosis not present

## 2019-11-12 DIAGNOSIS — R0981 Nasal congestion: Secondary | ICD-10-CM | POA: Diagnosis not present

## 2019-11-12 MED ORDER — IPRATROPIUM BROMIDE 0.03 % NA SOLN: 2.0000 | Freq: Two times a day (BID) | NASAL | 0 refills | Status: DC

## 2019-11-12 MED ORDER — SALINE SPRAY 0.65 % NA SOLN: 1.0000 | NASAL | 0 refills | Status: DC | PRN

## 2019-11-12 NOTE — Progress Notes (Signed)
We are sorry that you are not feeling well.  Here is how we plan to help!  Based on what you have shared with me it looks like you have sinusitis.  Sinusitis is inflammation and infection in the sinus cavities of the head.  Based on your presentation I believe you most likely have Acute Viral Sinusitis.This is an infection most likely caused by a virus. There is not specific treatment for viral sinusitis other than to help you with the symptoms until the infection runs its course.  You may use an oral decongestant such as Mucinex D or if you have glaucoma or high blood pressure use plain Mucinex. Saline nasal spray help and can safely be used as often as needed for congestion, I have prescribed: Ipratropium Bromide nasal spray 0.03% 2 sprays in eah nostril 2-3 times a day   You should also continue taking the allergy medication.    Finally, I've also prescribed a nasal saline spray.  This isn't the most pleasant medicine, but it can provide significant relief if used regularly.  Some authorities believe that zinc sprays or the use of Echinacea may shorten the course of your symptoms.  Antibiotics are not indicated for sinus problems unless the illness has lasted longer than 10 days or you have significant fever.  Sinus infections are not as easily transmitted as other respiratory infection, however we still recommend that you avoid close contact with loved ones, especially the very young and elderly.  Remember to wash your hands thoroughly throughout the day as this is the number one way to prevent the spread of infection!  Home Care:  Only take medications as instructed by your medical team.  Do not take these medications with alcohol.  A steam or ultrasonic humidifier can help congestion.  You can place a towel over your head and breathe in the steam from hot water coming from a faucet.  Avoid close contacts especially the very young and the elderly.  Cover your mouth when you cough or  sneeze.  Always remember to wash your hands.  Get Help Right Away If:  You develop worsening fever or sinus pain.  You develop a severe head ache or visual changes.  Your symptoms persist after you have completed your treatment plan.  Make sure you  Understand these instructions.  Will watch your condition.  Will get help right away if you are not doing well or get worse.  Your e-visit answers were reviewed by a board certified advanced clinical practitioner to complete your personal care plan.  Depending on the condition, your plan could have included both over the counter or prescription medications.  If there is a problem please reply  once you have received a response from your provider.  Your safety is important to [place].  If you have drug allergies check your prescription carefully.    You can use MyChart to ask questions about today's visit, request a non-urgent call back, or ask for a work or school excuse for 24 hours related to this e-Visit. If it has been greater than 24 hours you will need to follow up with your provider, or enter a new e-Visit to address those concerns.  You will get an e-mail in the next two days asking about your experience.  I hope that your e-visit has been valuable and will speed your recovery. Thank you for using e-visits.   Approximately 5 minutes was used in reviewing the patient's chart, questionnaire, prescribing medications, and documentation.

## 2019-11-19 ENCOUNTER — Encounter: Payer: Self-pay | Admitting: Physician Assistant

## 2019-11-20 ENCOUNTER — Encounter: Payer: Self-pay | Admitting: Physician Assistant

## 2019-12-06 ENCOUNTER — Other Ambulatory Visit: Payer: Self-pay | Admitting: Family Medicine

## 2019-12-06 DIAGNOSIS — F411 Generalized anxiety disorder: Secondary | ICD-10-CM

## 2019-12-11 ENCOUNTER — Other Ambulatory Visit: Payer: Self-pay | Admitting: Family Medicine

## 2019-12-11 ENCOUNTER — Encounter: Payer: Self-pay | Admitting: Physician Assistant

## 2019-12-11 DIAGNOSIS — F411 Generalized anxiety disorder: Secondary | ICD-10-CM

## 2019-12-11 MED ORDER — FLUOXETINE HCL 40 MG PO CAPS: 40.0000 mg | ORAL_CAPSULE | Freq: Every day | ORAL | 0 refills | Status: DC

## 2019-12-31 ENCOUNTER — Ambulatory Visit: Payer: 59 | Admitting: Physician Assistant

## 2019-12-31 ENCOUNTER — Other Ambulatory Visit: Payer: Self-pay

## 2020-01-08 ENCOUNTER — Other Ambulatory Visit: Payer: Self-pay | Admitting: Critical Care Medicine

## 2020-01-08 ENCOUNTER — Other Ambulatory Visit: Payer: 59

## 2020-01-08 DIAGNOSIS — Z20822 Contact with and (suspected) exposure to covid-19: Secondary | ICD-10-CM

## 2020-01-10 LAB — NOVEL CORONAVIRUS, NAA: SARS-CoV-2, NAA: DETECTED — AB

## 2020-01-10 LAB — SARS-COV-2, NAA 2 DAY TAT

## 2020-01-15 ENCOUNTER — Encounter: Payer: Self-pay | Admitting: Physician Assistant

## 2020-04-24 ENCOUNTER — Other Ambulatory Visit: Payer: Self-pay | Admitting: Physician Assistant

## 2020-04-24 DIAGNOSIS — F411 Generalized anxiety disorder: Secondary | ICD-10-CM

## 2020-07-28 ENCOUNTER — Ambulatory Visit (INDEPENDENT_AMBULATORY_CARE_PROVIDER_SITE_OTHER): Payer: 59 | Admitting: Physician Assistant

## 2020-07-28 ENCOUNTER — Other Ambulatory Visit: Payer: Self-pay

## 2020-07-28 VITALS — BP 115/76 | HR 72 | Temp 99.0°F | Ht 65.0 in | Wt 220.4 lb

## 2020-07-28 DIAGNOSIS — F411 Generalized anxiety disorder: Secondary | ICD-10-CM

## 2020-07-28 DIAGNOSIS — F909 Attention-deficit hyperactivity disorder, unspecified type: Secondary | ICD-10-CM

## 2020-07-28 DIAGNOSIS — E669 Obesity, unspecified: Secondary | ICD-10-CM | POA: Diagnosis not present

## 2020-07-28 DIAGNOSIS — Z713 Dietary counseling and surveillance: Secondary | ICD-10-CM

## 2020-07-28 DIAGNOSIS — Z6836 Body mass index (BMI) 36.0-36.9, adult: Secondary | ICD-10-CM

## 2020-07-28 DIAGNOSIS — Z0000 Encounter for general adult medical examination without abnormal findings: Secondary | ICD-10-CM | POA: Diagnosis not present

## 2020-07-28 MED ORDER — BUPROPION HCL ER (XL) 300 MG PO TB24: 300.0000 mg | ORAL_TABLET | Freq: Every day | ORAL | 0 refills | Status: DC

## 2020-07-28 MED ORDER — FLUOXETINE HCL 40 MG PO CAPS: 40.0000 mg | ORAL_CAPSULE | Freq: Every day | ORAL | 0 refills | Status: DC

## 2020-07-28 MED ORDER — AMPHETAMINE-DEXTROAMPHET ER 10 MG PO CP24: 10.0000 mg | ORAL_CAPSULE | Freq: Every day | ORAL | 0 refills | Status: DC

## 2020-07-28 MED ORDER — SAXENDA 18 MG/3ML ~~LOC~~ SOPN: PEN_INJECTOR | SUBCUTANEOUS | 0 refills | Status: DC

## 2020-07-28 NOTE — Patient Instructions (Signed)

## 2020-07-28 NOTE — Progress Notes (Signed)
Established Patient Office Visit  Subjective:  Patient ID: Kelsey Ayers, female    DOB: [birth year]  Age: 25 y.o. MRN: [id number]  CC:  Chief Complaint  Patient presents with  . Medication Refill    HPI Kelsey Ayers presents for medication refills and discuss concerns about ADHD. States has trouble with focusing and concentrating at work. Cannot relax at night. No prior history of ADHD/ADD.   Mood: Requesting refills of Wellbutrin XL and Prozac. Patient reports medication noncompliance, has missed doses of medication. Denies SI/HI. Is stressed with work and ensuring paying her bills and buying necessities.   Weight: Patient did not tolerate phentermine well last time it was prescribed, reports when on medication in the past tolerated medication without issues. Has tried to use MyFitnes Pal and Mu Plate with minimal success. Since it is starting to get warmer plans to start being more active with walking or outdoor activities. Is interested on trying something different for weight.    Past Medical History:  Diagnosis Date  . Allergy     Past Surgical History:  Procedure Laterality Date  . FOOT SURGERY Right   . WISDOM TOOTH EXTRACTION      Family History  Problem Relation Age of Onset  . Hypertension Father   . Diabetes Sister     Social History   Socioeconomic History  . Marital status: Single    Spouse name: Not on file  . Number of children: Not on file  . Years of education: Not on file  . Highest education level: Not on file  Occupational History  . Not on file  Tobacco Use  . Smoking status: Never Smoker  . Smokeless tobacco: Never Used  Vaping Use  . Vaping Use: Never used  Substance and Sexual Activity  . Alcohol use: Yes    Alcohol/week: 1.0 standard drink    Types: 1 Glasses of wine per week  . Drug use: No  . Sexual activity: Never    Birth control/protection: None  Other Topics Concern  . Not on file  Social History Narrative  . Not on file    Social Determinants of Health   Financial Resource Strain: Not on file  Food Insecurity: Not on file  Transportation Needs: Not on file  Physical Activity: Not on file  Stress: Not on file  Social Connections: Not on file  Intimate Partner Violence: Not on file    Outpatient Medications Prior to Visit  Medication Sig Dispense Refill  . ipratropium (ATROVENT) 0.03 % nasal spray Place 2 sprays into both nostrils every 12 (twelve) hours. 30 mL 0  . Norethindrone Acetate-Ethinyl Estrad-FE (BLISOVI 24 FE) 1-20 MG-MCG(24) tablet Take 1 tablet by mouth daily.    . phentermine 37.5 MG capsule Take 1 capsule (37.5 mg total) by mouth every morning. (appt needed 4 RF) 30 capsule 0  . sodium chloride (OCEAN) 0.65 % SOLN nasal spray Place 1 spray into both nostrils as needed for congestion. 60 mL 0  . buPROPion (WELLBUTRIN XL) 300 MG 24 hr tablet Take 1 tablet (300 mg total) by mouth daily. 30 tablet 2  . FLUoxetine (PROZAC) 40 MG capsule TAKE 1 CAPSULE BY MOUTH DAILY 60 capsule 0   No facility-administered medications prior to visit.    Allergies  Allergen Reactions  . Other Itching    Cats, dogs    ROS Review of Systems  A fourteen system review of systems was performed and found to be positive as per HPI.  Objective:    Physical Exam General:  Well Developed, well nourished, in no acute distress Neuro:  Alert and oriented,  extra-ocular muscles intact  HEENT:  Normocephalic, atraumatic, neck supple Skin:  no gross rash, warm, pink. Cardiac:  RRR, S1 S2 wnl's, w/o murmur  Respiratory:  ECTA B/L, Not using accessory muscles, speaking in full sentences- unlabored. Vascular:  Ext warm, no cyanosis apprec.; cap RF less 2 sec. Psych:  No HI/SI, judgement and insight good, Euthymic mood. Full Affect.   BP 115/76   Pulse 72   Temp 99 F (37.2 C)   Ht 5\' 5"  (1.651 m)   Wt 220 lb 6.4 oz (100 kg)   SpO2 100%   BMI 36.68 kg/m  Wt Readings from Last 3 Encounters:  07/28/20 220  lb 6.4 oz (100 kg)  10/28/19 201 lb 6.4 oz (91.4 kg)  09/26/19 198 lb 11.2 oz (90.1 kg)     Health Maintenance Due  Topic Date Due  . Hepatitis C Screening  Never done  . COVID-19 Vaccine (1) Never done  . HIV Screening  Never done  . TETANUS/TDAP  Never done  . PAP-Cervical Cytology Screening  Never done  . PAP SMEAR-Modifier  Never done  . INFLUENZA VACCINE  12/15/2019    There are no preventive care reminders to display for this patient.  Lab Results  Component Value Date   TSH 2.830 01/24/2019   Lab Results  Component Value Date   WBC 8.3 01/24/2019   HGB 13.5 01/24/2019   HCT 42.0 01/24/2019   MCV 86 01/24/2019   PLT 317 01/24/2019   Lab Results  Component Value Date   NA 137 01/24/2019   K 4.4 01/24/2019   CO2 22 01/24/2019   GLUCOSE 86 01/24/2019   BUN 8 01/24/2019   CREATININE 0.69 01/24/2019   BILITOT 0.2 01/24/2019   ALKPHOS 68 01/24/2019   AST 17 01/24/2019   ALT 13 01/24/2019   PROT 6.9 01/24/2019   ALBUMIN 4.2 01/24/2019   CALCIUM 9.1 01/24/2019   Lab Results  Component Value Date   CHOL 169 01/24/2019   Lab Results  Component Value Date   HDL 50 01/24/2019   Lab Results  Component Value Date   LDLCALC 101 (H) 01/24/2019   Lab Results  Component Value Date   TRIG 99 01/24/2019   Lab Results  Component Value Date   CHOLHDL 3.4 01/24/2019   Lab Results  Component Value Date   HGBA1C 5.2 01/24/2019      Assessment & Plan:   Problem List Items Addressed This Visit      Other   Healthcare maintenance   GAD (generalized anxiety disorder) - Primary   Relevant Medications   buPROPion (WELLBUTRIN XL) 300 MG 24 hr tablet   FLUoxetine (PROZAC) 40 MG capsule    Other Visit Diagnoses    Class 2 obesity with body mass index (BMI) of 36.0 to 36.9 in adult, unspecified obesity type, unspecified whether serious comorbidity present       Relevant Medications   Liraglutide -Weight Management (SAXENDA) 18 MG/3ML SOPN    amphetamine-dextroamphetamine (ADDERALL XR) 10 MG 24 hr capsule   Adult ADHD       Relevant Medications   amphetamine-dextroamphetamine (ADDERALL XR) 10 MG 24 hr capsule     GAD: -PHQ-9 score of 14, increased from prior and likely related to medication noncompliance. Recommend medication adherence. Will provide medication refills. -Recommend to incorporate mindfulness therapy. -Will continue to monitor.  Class  2 obesity with body mass index (BMI) of 36.0 to 36.9 in adult, unspecified obesity type, unspecified whether serious comorbidity present: -Patient has gained 19 pounds since last visit. Discussed alternative medications including potential side effects and prefers to start Saxenda. No personal or FHx of MEN or MTC. -Discussed in addition to medication, recommend diet and lifestyle changes such as increasing physical activity with ultimate goal of 150 mins/wk. Advised to follow a Mediterranean diet. -Follow up in 4 weeks to reassess weight and treatment therapy.   Adult ADHD: -Patient completed Adult ADHD Self-Report Scale Symptom Checklist and scores are indicative symptoms consistent with ADHD. Discussed with patient management options including medications and potential side effects. Will start low dose Adderall XR 10 mg. -Follow up in 4 weeks to reassess symptoms and medication therapy.  Advised patient to schedule lab visit for FBW and recommend scheduling CPE in the near future.  Meds ordered this encounter  Medications  . Liraglutide -Weight Management (SAXENDA) 18 MG/3ML SOPN    Sig: Inject 0.6 mg [place] once daily x 1 wk. Then increase dose by 0.6 mg/day every 7 days to target of 3 mg/day.    Dispense:  15 mL    Refill:  0    Order Specific Question:   Supervising Provider    Answer:   Nani Gasser D [[id number]]  . buPROPion (WELLBUTRIN XL) 300 MG 24 hr tablet    Sig: Take 1 tablet (300 mg total) by mouth daily.    Dispense:  90 tablet    Refill:  0    Order Specific  Question:   Supervising Provider    Answer:   Nani Gasser D [[id number]]  . FLUoxetine (PROZAC) 40 MG capsule    Sig: Take 1 capsule (40 mg total) by mouth daily.    Dispense:  90 capsule    Refill:  0    Order Specific Question:   Supervising Provider    Answer:   Nani Gasser D [[id number]]  . amphetamine-dextroamphetamine (ADDERALL XR) 10 MG 24 hr capsule    Sig: Take 1 capsule (10 mg total) by mouth daily.    Dispense:  30 capsule    Refill:  0    Order Specific Question:   Supervising Provider    Answer:   Nani Gasser D [[id number]]    Follow-up: Return in about 4 weeks (around 08/25/2020) for Wt, ADHD- started med; lab visit for FBW in 1-3 weeks.    Mayer Masker, PA-C

## 2020-07-29 ENCOUNTER — Encounter: Payer: Self-pay | Admitting: Physician Assistant

## 2020-07-31 ENCOUNTER — Other Ambulatory Visit: Payer: Self-pay | Admitting: Physician Assistant

## 2020-07-31 DIAGNOSIS — Z0000 Encounter for general adult medical examination without abnormal findings: Secondary | ICD-10-CM

## 2020-07-31 DIAGNOSIS — Z6836 Body mass index (BMI) 36.0-36.9, adult: Secondary | ICD-10-CM

## 2020-07-31 DIAGNOSIS — E669 Obesity, unspecified: Secondary | ICD-10-CM

## 2020-08-03 ENCOUNTER — Telehealth: Payer: Self-pay | Admitting: Physician Assistant

## 2020-08-03 NOTE — Telephone Encounter (Signed)
Patient will call her insurance company and see what medications are covered and call us back.

## 2020-08-03 NOTE — Telephone Encounter (Signed)
Please advise patient the PA was denied by insurance company for the [place]. Please advise patient to call her insurance company to see what weight loss medications they will cover and call back to let us know.

## 2020-08-03 NOTE — Telephone Encounter (Signed)
Patient states her Kelsey Ayers needs a pre authorization. Her pharmacy is [facility], thanks.

## 2020-08-07 ENCOUNTER — Other Ambulatory Visit: Payer: 59

## 2020-08-07 ENCOUNTER — Other Ambulatory Visit: Payer: Self-pay

## 2020-08-07 DIAGNOSIS — Z6836 Body mass index (BMI) 36.0-36.9, adult: Secondary | ICD-10-CM

## 2020-08-07 DIAGNOSIS — E669 Obesity, unspecified: Secondary | ICD-10-CM

## 2020-08-07 DIAGNOSIS — Z0000 Encounter for general adult medical examination without abnormal findings: Secondary | ICD-10-CM

## 2020-08-08 LAB — CBC
Hematocrit: 42.3 % (ref 34.0–46.6)
Hemoglobin: 13.8 g/dL (ref 11.1–15.9)
MCH: 27.7 pg (ref 26.6–33.0)
MCHC: 32.6 g/dL (ref 31.5–35.7)
MCV: 85 fL (ref 79–97)
Platelets: 332 10*3/uL (ref 150–450)
RBC: 4.98 x10E6/uL (ref 3.77–5.28)
RDW: 13 % (ref 11.7–15.4)
WBC: 11 10*3/uL — ABNORMAL HIGH (ref 3.4–10.8)

## 2020-08-08 LAB — COMPREHENSIVE METABOLIC PANEL
ALT: 11 IU/L (ref 0–32)
AST: 14 IU/L (ref 0–40)
Albumin/Globulin Ratio: 1.3 (ref 1.2–2.2)
Albumin: 4.4 g/dL (ref 3.9–5.0)
Alkaline Phosphatase: 79 IU/L (ref 44–121)
BUN/Creatinine Ratio: 10 (ref 9–23)
BUN: 8 mg/dL (ref 6–20)
Bilirubin Total: 0.2 mg/dL (ref 0.0–1.2)
CO2: 16 mmol/L — ABNORMAL LOW (ref 20–29)
Calcium: 9.3 mg/dL (ref 8.7–10.2)
Chloride: 104 mmol/L (ref 96–106)
Creatinine, Ser: 0.77 mg/dL (ref 0.57–1.00)
Globulin, Total: 3.4 g/dL (ref 1.5–4.5)
Glucose: 90 mg/dL (ref 65–99)
Potassium: 4.5 mmol/L (ref 3.5–5.2)
Sodium: 137 mmol/L (ref 134–144)
Total Protein: 7.8 g/dL (ref 6.0–8.5)
eGFR: 108 mL/min/{1.73_m2} (ref >59)

## 2020-08-08 LAB — LIPID PANEL
Chol/HDL Ratio: 3.2 ratio (ref 0.0–4.4)
Cholesterol, Total: 190 mg/dL (ref 100–199)
HDL: 59 mg/dL (ref >39)
LDL Chol Calc (NIH): 113 mg/dL — ABNORMAL HIGH (ref 0–99)
Triglycerides: 98 mg/dL (ref 0–149)
VLDL Cholesterol Cal: 18 mg/dL (ref 5–40)

## 2020-08-08 LAB — HEMOGLOBIN A1C
Est. average glucose Bld gHb Est-mCnc: 111 mg/dL
Hgb A1c MFr Bld: 5.5 % (ref 4.8–5.6)

## 2020-08-08 LAB — TSH: TSH: 2.24 u[IU]/mL (ref 0.450–4.500)

## 2020-08-11 ENCOUNTER — Telehealth: Payer: Self-pay | Admitting: Physician Assistant

## 2020-08-11 NOTE — Telephone Encounter (Signed)
Opened in error

## 2020-08-11 NOTE — Telephone Encounter (Signed)
Per Kandis Cocking bringing pt in office for evaluation. Pt scheduled for tomorrow at 8am with Heather. AS, CMA

## 2020-08-11 NOTE — Telephone Encounter (Signed)
Patient also states she had a fever of up to 104 yesterday. She has also been battling a sinus infection since last Thursday. She took an at home COVID test that was negative. She also states pain in throat and can barely eat. Thanks, please advise.

## 2020-08-11 NOTE — Telephone Encounter (Signed)
Please see telephone encounter concerning same issue. AS, CMA

## 2020-08-11 NOTE — Telephone Encounter (Signed)
Patient reached out through Mychart requesting an appointment. Her symptoms include a fever, chills, vomiting, and awful pain when swallowing. She also would like a strep test. Please advise, thanks.

## 2020-08-12 ENCOUNTER — Ambulatory Visit (INDEPENDENT_AMBULATORY_CARE_PROVIDER_SITE_OTHER): Payer: 59 | Admitting: Nurse Practitioner

## 2020-08-12 ENCOUNTER — Encounter: Payer: Self-pay | Admitting: Nurse Practitioner

## 2020-08-12 ENCOUNTER — Telehealth: Payer: Self-pay | Admitting: Physician Assistant

## 2020-08-12 ENCOUNTER — Other Ambulatory Visit: Payer: Self-pay

## 2020-08-12 VITALS — BP 108/72 | HR 90 | Temp 98.2°F | Ht 64.0 in | Wt 216.5 lb

## 2020-08-12 DIAGNOSIS — J020 Streptococcal pharyngitis: Secondary | ICD-10-CM | POA: Diagnosis not present

## 2020-08-12 LAB — POCT INFLUENZA A/B
Influenza A, POC: NEGATIVE
Influenza B, POC: NEGATIVE

## 2020-08-12 LAB — POCT RAPID STREP A (OFFICE): Rapid Strep A Screen: POSITIVE — AB

## 2020-08-12 MED ORDER — AMOXICILLIN 400 MG/5ML PO SUSR: 800.0000 mg | Freq: Two times a day (BID) | ORAL | 0 refills | Status: DC

## 2020-08-12 MED ORDER — LIDOCAINE VISCOUS HCL 2 % MT SOLN: 5.0000 mL | Freq: Four times a day (QID) | OROMUCOSAL | 0 refills | Status: DC | PRN

## 2020-08-12 NOTE — Progress Notes (Signed)
Acute Office Visit  Subjective:    Patient ID: Kelsey Ayers, female    DOB: [birth year], 25 y.o.   MRN: [id number]  Chief Complaint  Patient presents with  . Sore Throat    HPI Patient is in today for evaluation of sore throat. She states that she started having sore throat and sinus congestion a week ago. She states that on Monday, symptoms symptoms settled in her throat. She developed fever, chills, headache, and swollen lymph nodes. She is hoarse and it really hurts to swallow. She has been taking OTC tylenol and using sore thorat lozenges but these meds have helped very little. She states that when she developed fever on Monday, she did take an at-home test for COVID 19 test and it was negative. She states that she has had little nausea and vomiting. Her appetite is decreased.   Past Medical History:  Diagnosis Date  . Allergy     Past Surgical History:  Procedure Laterality Date  . FOOT SURGERY Right   . WISDOM TOOTH EXTRACTION      Family History  Problem Relation Age of Onset  . Hypertension Father   . Diabetes Sister     Social History   Socioeconomic History  . Marital status: Single    Spouse name: Not on file  . Number of children: Not on file  . Years of education: Not on file  . Highest education level: Not on file  Occupational History  . Not on file  Tobacco Use  . Smoking status: Never Smoker  . Smokeless tobacco: Never Used  Vaping Use  . Vaping Use: Never used  Substance and Sexual Activity  . Alcohol use: Yes    Alcohol/week: 1.0 standard drink    Types: 1 Glasses of wine per week  . Drug use: No  . Sexual activity: Never    Birth control/protection: None  Other Topics Concern  . Not on file  Social History Narrative  . Not on file   Social Determinants of Health   Financial Resource Strain: Not on file  Food Insecurity: Not on file  Transportation Needs: Not on file  Physical Activity: Not on file  Stress: Not on file  Social  Connections: Not on file  Intimate Partner Violence: Not on file    Outpatient Medications Prior to Visit  Medication Sig Dispense Refill  . amphetamine-dextroamphetamine (ADDERALL XR) 10 MG 24 hr capsule Take 1 capsule (10 mg total) by mouth daily. 30 capsule 0  . buPROPion (WELLBUTRIN XL) 300 MG 24 hr tablet Take 1 tablet (300 mg total) by mouth daily. 90 tablet 0  . FLUoxetine (PROZAC) 40 MG capsule Take 1 capsule (40 mg total) by mouth daily. 90 capsule 0  . ipratropium (ATROVENT) 0.03 % nasal spray Place 2 sprays into both nostrils every 12 (twelve) hours. 30 mL 0  . Norethindrone Acetate-Ethinyl Estrad-FE (BLISOVI 24 FE) 1-20 MG-MCG(24) tablet Take 1 tablet by mouth daily.    . sodium chloride (OCEAN) 0.65 % SOLN nasal spray Place 1 spray into both nostrils as needed for congestion. 60 mL 0  . Liraglutide -Weight Management (SAXENDA) 18 MG/3ML SOPN Inject 0.6 mg [place] once daily x 1 wk. Then increase dose by 0.6 mg/day every 7 days to target of 3 mg/day. 15 mL 0   No facility-administered medications prior to visit.    Allergies  Allergen Reactions  . Other Itching    Cats, dogs    Review of Systems  Constitutional:  Positive for appetite change, chills, fatigue and fever.  HENT: Positive for congestion, postnasal drip, sinus pressure, sore throat and trouble swallowing. Negative for sinus pain.   Eyes: Negative.   Respiratory: Negative for cough and wheezing.   Cardiovascular: Negative for chest pain and palpitations.  Gastrointestinal: Positive for nausea. Negative for constipation.  Musculoskeletal: Positive for arthralgias. Negative for back pain and myalgias.  Skin: Negative for rash.  Neurological: Positive for headaches. Negative for dizziness and weakness.  Hematological: Positive for adenopathy.  Psychiatric/Behavioral: The patient is not nervous/anxious.   All other systems reviewed and are negative.      Objective:    Physical Exam Vitals and nursing note  reviewed.  Constitutional:      Appearance: She is well-developed. She is ill-appearing.  HENT:     Head: Normocephalic.     Right Ear: Tympanic membrane is erythematous.     Left Ear: Tympanic membrane is erythematous.     Nose: Congestion present.     Mouth/Throat:     Pharynx: Pharyngeal swelling and posterior oropharyngeal erythema present.     Tonsils: 1+ on the right. 1+ on the left.  Eyes:     Conjunctiva/sclera: Conjunctivae normal.     Pupils: Pupils are equal, round, and reactive to light.  Cardiovascular:     Rate and Rhythm: Normal rate and regular rhythm.     Heart sounds: Normal heart sounds.  Pulmonary:     Breath sounds: Normal breath sounds.  Abdominal:     Palpations: Abdomen is soft.  Lymphadenopathy:     Cervical: Cervical adenopathy present.  Skin:    General: Skin is warm and dry.     Capillary Refill: Capillary refill takes less than 2 seconds.  Neurological:     General: No focal deficit present.     Mental Status: She is alert.  Psychiatric:        Mood and Affect: Mood normal.        Behavior: Behavior normal.     Today's Vitals   08/12/20 0821  BP: 108/72  Pulse: 90  Temp: 98.2 F (36.8 C)  SpO2: 99%  Weight: 216 lb 8 oz (98.2 kg)  Height: 5\' 4"  (1.626 m)   Body mass index is 37.16 kg/m.   Wt Readings from Last 3 Encounters:  08/12/20 216 lb 8 oz (98.2 kg)  07/28/20 220 lb 6.4 oz (100 kg)  10/28/19 201 lb 6.4 oz (91.4 kg)    Health Maintenance Due  Topic Date Due  . Hepatitis C Screening  Never done  . HIV Screening  Never done  . PAP-Cervical Cytology Screening  Never done  . PAP SMEAR-Modifier  Never done    There are no preventive care reminders to display for this patient.   Lab Results  Component Value Date   TSH 2.240 08/07/2020   Lab Results  Component Value Date   WBC 11.0 (H) 08/07/2020   HGB 13.8 08/07/2020   HCT 42.3 08/07/2020   MCV 85 08/07/2020   PLT 332 08/07/2020   Lab Results  Component Value  Date   NA 137 08/07/2020   K 4.5 08/07/2020   CO2 16 (L) 08/07/2020   GLUCOSE 90 08/07/2020   BUN 8 08/07/2020   CREATININE 0.77 08/07/2020   BILITOT 0.2 08/07/2020   ALKPHOS 79 08/07/2020   AST 14 08/07/2020   ALT 11 08/07/2020   PROT 7.8 08/07/2020   ALBUMIN 4.4 08/07/2020   CALCIUM 9.3 08/07/2020   Lab  Results  Component Value Date   CHOL 190 08/07/2020   Lab Results  Component Value Date   HDL 59 08/07/2020   Lab Results  Component Value Date   LDLCALC 113 (H) 08/07/2020   Lab Results  Component Value Date   TRIG 98 08/07/2020   Lab Results  Component Value Date   CHOLHDL 3.2 08/07/2020   Lab Results  Component Value Date   HGBA1C 5.5 08/07/2020       Assessment & Plan:  1. Strep pharyngitis Negative flu, positive strep A. Start amoxicillin suspension, taking BID for next 10 days. May swish and swallow with [employer] Magic Mouthwash up to four times daily as needed to reduce symptom of sore throat. She should rest and increase fluids. Hot or cold liquids as tolerated. She should stay out of work remainder of today and tomorrow. Patient voiced understanding and agreement with the plan.  - amoxicillin (AMOXIL) 400 MG/5ML suspension; Take 10 mLs (800 mg total) by mouth 2 (two) times daily.  Dispense: 200 mL; Refill: 0 - magic mouthwash (lidocaine, diphenhydrAMINE, alum & mag hydroxide) suspension; Swish and swallow 5 mLs 4 (four) times daily as needed for mouth pain.  Dispense: 200 mL; Refill: 0 - POCT rapid strep A - POCT Influenza A/B  Problem List Items Addressed This Visit      Respiratory   Strep pharyngitis - Primary   Relevant Medications   amoxicillin (AMOXIL) 400 MG/5ML suspension   magic mouthwash (lidocaine, diphenhydrAMINE, alum & mag hydroxide) suspension   Other Relevant Orders   POCT rapid strep A (Completed)   POCT Influenza A/B (Completed)       Meds ordered this encounter  Medications  . amoxicillin (AMOXIL) 400 MG/5ML suspension     Sig: Take 10 mLs (800 mg total) by mouth 2 (two) times daily.    Dispense:  200 mL    Refill:  0    Order Specific Question:   Supervising Provider    Answer:   Nani Gasser D [[id number]]  . magic mouthwash (lidocaine, diphenhydrAMINE, alum & mag hydroxide) suspension    Sig: Swish and swallow 5 mLs 4 (four) times daily as needed for mouth pain.    Dispense:  200 mL    Refill:  0    Order Specific Question:   Supervising Provider    Answer:   Nani Gasser D [[id number]]   Time spent with the patient was approximately 25 minutes. This time included reviewing progress notes, labs, imaging studies, and discussing plan for follow up.   Carlean Jews, NP

## 2020-08-12 NOTE — Telephone Encounter (Signed)
Components for medication given verbally to Charlie at [facility]. AS, CMA

## 2020-08-12 NOTE — Patient Instructions (Signed)
Strep Throat, Adult Strep throat is an infection of the throat. It is caused by germs (bacteria). Strep throat is common during the cold months of the year. It mostly affects children who are 5-27 years old. However, people of all ages can get it at any time of the year. When strep throat affects the tonsils, it is called tonsillitis. When it affects the back of the throat, it is called pharyngitis. This infection spreads from person to person through coughing, sneezing, or having close contact. What are the causes? This condition is caused by the Streptococcus pyogenes germ. What increases the risk? You are more likely to develop this condition if:  You care for young children. Children are more likely to get strep throat and may spread it to others.  You go to crowded places. Germs can spread easily in such places.  You kiss or touch someone who has strep throat. What are the signs or symptoms? Symptoms of this condition include:  Fever or chills.  Redness, swelling, or pain in the tonsils or throat.  Pain or trouble when swallowing.  White or yellow spots on the tonsils or throat.  Tender glands in the neck and under the jaw.  Bad breath.  Red rash all over the body. This is rare. How is this treated? This condition may be treated with:  Medicines that kill germs (antibiotics).  Medicines that treat pain or fever. These include: ? Ibuprofen or acetaminophen. ? Aspirin, only for patients who are over the age of 18. ? Throat lozenges. ? Throat sprays. Follow these instructions at home: Medicines  Take over-the-counter and prescription medicines only as told by your doctor.  Take your antibiotic medicine as told by your doctor. Do not stop taking the antibiotic even if you start to feel better.   Eating and drinking  If you have trouble swallowing, eat soft foods until your throat feels better.  Drink enough fluid to keep your pee (urine) pale yellow.  To help with  pain, you may have: ? Warm fluids, such as soup and tea. ? Cold fluids, such as frozen desserts or popsicles.   General instructions  Rinse your mouth (gargle) with a salt-water mixture 3-4 times a day or as needed. To make a salt-water mixture, dissolve -1 tsp (3-6 g) of salt in 1 cup (237 mL) of warm water.  Rest as much as you can.  Stay home from work or school until you have been taking antibiotics for 24 hours.  Avoid smoking or being around people who smoke.  Keep all follow-up visits as told by your doctor. This is important. How is this prevented?  Do not share food, drinking cups, or personal items. They can cause the germs to spread.  Wash your hands well with soap and water. Make sure that all people in your house wash their hands well.  Have family members tested if they have a fever or a sore throat. They may need an antibiotic if they have strep throat.   Contact a doctor if:  You have swelling in your neck that keeps getting bigger.  You get a rash, cough, or earache.  You cough up a thick fluid that is green, yellow-brown, or bloody.  You have pain that does not get better with medicine.  Your symptoms get worse instead of getting better.  You have a fever. Get help right away if:  You vomit.  You have a very bad headache.  Your neck hurts or feels stiff.    You have chest pain or are short of breath.  You have drooling, very bad throat pain, or changes in your voice.  Your neck is swollen, or the skin gets red and tender.  Your mouth is dry, or you are peeing less than normal.  You keep feeling more tired or have trouble waking up.  Your joints are red or painful. Summary  Strep throat is an infection of the throat. It is caused by germs (bacteria).  This infection can spread from person to person through coughing, sneezing, or having close contact.  Take your medicines, including antibiotics, as told by your doctor. Do not stop taking the  antibiotic even if you start to feel better.  To prevent the spread of germs, wash your hands well with soap and water. Have others do the same. Do not share food, drinking cups, or personal items.  Get help right away if you have a bad headache, chest pain, shortness of breath, a stiff or painful neck, or you vomit. This information is not intended to replace advice given to you by your health care provider. Make sure you discuss any questions you have with your health care provider. Document Revised: 07/20/2018 Document Reviewed: 07/20/2018 Elsevier Patient Education  2021 [employer]  

## 2020-08-13 ENCOUNTER — Encounter: Payer: Self-pay | Admitting: Physician Assistant

## 2020-08-13 DIAGNOSIS — E669 Obesity, unspecified: Secondary | ICD-10-CM

## 2020-08-13 DIAGNOSIS — Z6836 Body mass index (BMI) 36.0-36.9, adult: Secondary | ICD-10-CM

## 2020-08-17 MED ORDER — PHENTERMINE HCL 8 MG PO TABS
ORAL_TABLET | ORAL | 0 refills | Status: DC
Start: 2020-08-17 — End: 2020-09-30

## 2020-08-17 MED ORDER — TOPIRAMATE 25 MG PO TABS: 25.0000 mg | ORAL_TABLET | Freq: Every day | ORAL | 0 refills | Status: DC

## 2020-08-19 ENCOUNTER — Encounter: Payer: Self-pay | Admitting: Nurse Practitioner

## 2020-08-25 ENCOUNTER — Ambulatory Visit: Payer: 59 | Admitting: Physician Assistant

## 2020-09-07 ENCOUNTER — Encounter: Payer: Self-pay | Admitting: Physician Assistant

## 2020-09-10 ENCOUNTER — Other Ambulatory Visit: Payer: Self-pay | Admitting: Physician Assistant

## 2020-09-10 DIAGNOSIS — E669 Obesity, unspecified: Secondary | ICD-10-CM

## 2020-09-14 ENCOUNTER — Other Ambulatory Visit: Payer: Self-pay | Admitting: Physician Assistant

## 2020-09-14 DIAGNOSIS — F909 Attention-deficit hyperactivity disorder, unspecified type: Secondary | ICD-10-CM

## 2020-09-15 NOTE — Telephone Encounter (Signed)
Patient last seen 07/28/20 for ADHD and advised to follow up in 3 months.    Last refill given 07/28/20 30 day supply. Please approve if refill appropriate. AS, CMA

## 2020-09-16 ENCOUNTER — Other Ambulatory Visit: Payer: Self-pay | Admitting: Physician Assistant

## 2020-09-16 DIAGNOSIS — F909 Attention-deficit hyperactivity disorder, unspecified type: Secondary | ICD-10-CM

## 2020-09-30 ENCOUNTER — Ambulatory Visit: Payer: 59 | Admitting: Physician Assistant

## 2020-09-30 ENCOUNTER — Other Ambulatory Visit: Payer: Self-pay

## 2020-09-30 ENCOUNTER — Encounter: Payer: Self-pay | Admitting: Physician Assistant

## 2020-09-30 VITALS — BP 113/69 | HR 66 | Temp 98.8°F | Ht 65.0 in | Wt 210.9 lb

## 2020-09-30 DIAGNOSIS — Z6835 Body mass index (BMI) 35.0-35.9, adult: Secondary | ICD-10-CM | POA: Diagnosis not present

## 2020-09-30 DIAGNOSIS — F411 Generalized anxiety disorder: Secondary | ICD-10-CM

## 2020-09-30 DIAGNOSIS — F909 Attention-deficit hyperactivity disorder, unspecified type: Secondary | ICD-10-CM | POA: Diagnosis not present

## 2020-09-30 MED ORDER — ADDERALL XR 10 MG PO CP24: 10.0000 mg | ORAL_CAPSULE | Freq: Every day | ORAL | 0 refills | Status: DC

## 2020-09-30 MED ORDER — PHENTERMINE HCL 8 MG PO TABS: 1.0000 | ORAL_TABLET | Freq: Every day | ORAL | 0 refills | Status: DC

## 2020-09-30 NOTE — Progress Notes (Signed)
 Established Patient Office Visit  Subjective:  Patient ID: Kelsey Ayers, female    DOB: 10/03/1995  Age: 25 y.o. MRN: [id number]  CC:  Chief Complaint  Patient presents with  . Follow-up  . ADHD  . Weight Loss    HPI Kelsey Ayers presents for follow-up on ADHD and weight management.  ADHD: Patient reports since starting medication has noticed a difference. 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 States since she ran out of medication has been struggling with staying focused and on task.  Denies insomnia, sleep disturbance, chest pain, palpitations or mood changes.  Weight: Patient has been noncompliant with medications, sometimes takes topiramate. Reports would like to trial phentermine again.  When she tried medication the second time she did not tolerate well and feels like it was related to increased stress and anxiety which are better now. Has made small diet changes and tries to google tips for Mediterranean diet but does not feel that the information is reliable.  Mood: Reports medication compliance. Denies SI/HI. Reports anxiety and feeling down has been stable. Personal stress levels have improved.  Past Medical History:  Diagnosis Date  . Allergy     Past Surgical History:  Procedure Laterality Date  . FOOT SURGERY Right   . WISDOM TOOTH EXTRACTION      Family History  Problem Relation Age of Onset  . Hypertension Father   . Diabetes Sister     Social History   Socioeconomic History  . Marital status: Single    Spouse name: Not on file  . Number of children: Not on file  . Years of education: Not on file  . Highest education level: Not on file  Occupational History  . Not on file  Tobacco Use  . Smoking status: Never Smoker  . Smokeless tobacco: Never Used  Vaping Use  . Vaping Use: Never used  Substance and Sexual Activity  . Alcohol use: Yes    Alcohol/week: 1.0  standard drink    Types: 1 Glasses of wine per week  . Drug use: No  . Sexual activity: Never    Birth control/protection: None  Other Topics Concern  . Not on file  Social History Narrative  . Not on file   Social Determinants of Health   Financial Resource Strain: Not on file  Food Insecurity: Not on file  Transportation Needs: Not on file  Physical Activity: Not on file  Stress: Not on file  Social Connections: Not on file  Intimate Partner Violence: Not on file    Outpatient Medications Prior to Visit  Medication Sig Dispense Refill  . buPROPion (WELLBUTRIN XL) 300 MG 24 hr tablet Take 1 tablet (300 mg total) by mouth daily. 90 tablet 0  . FLUoxetine (PROZAC) 40 MG capsule Take 1 capsule (40 mg total) by mouth daily. 90 capsule 0  . ipratropium (ATROVENT) 0.03 % nasal spray Place 2 sprays into both nostrils every 12 (twelve) hours. 30 mL 0  . ADDERALL XR 10 MG 24 hr capsule TAKE 1 CAPSULE BY MOUTH DAILY 30 capsule 0  . Phentermine HCl 8 MG TABS Take 0.5 tablet by mouth daily with breakfast. 15 tablet 0  . topiramate (TOPAMAX) 25 MG tablet TAKE 1 TABLET BY MOUTH DAILY 30 tablet 0  . amoxicillin (AMOXIL) 400 MG/5ML suspension Take 10 mLs (800 mg total) by mouth 2 (two) times daily. (Patient not taking: Reported on   09/30/2020) 200 mL 0  . magic mouthwash (lidocaine, diphenhydrAMINE, alum & mag hydroxide) suspension Swish and swallow 5 mLs 4 (four) times daily as needed for mouth pain. 200 mL 0  . Norethindrone Acetate-Ethinyl Estrad-FE (BLISOVI 24 FE) 1-20 MG-MCG(24) tablet Take 1 tablet by mouth daily.    . sodium chloride (OCEAN) 0.65 % SOLN nasal spray Place 1 spray into both nostrils as needed for congestion. 60 mL 0   No facility-administered medications prior to visit.    Allergies  Allergen Reactions  . Other Itching    Cats, dogs    ROS Review of Systems Review of Systems:  A fourteen system review of systems was performed and found to be positive as per HPI.    Objective:    Physical Exam General:  Well Developed, well nourished, appropriate for stated age.  Neuro:  Alert and oriented,  extra-ocular muscles intact  HEENT:  Normocephalic, atraumatic, neck supple Skin:  no gross rash, warm, pink. Cardiac:  RRR, S1 S2, no murmur Respiratory:  ECTA B/L without wheezing, Not using accessory muscles, speaking in full sentences- unlabored. Vascular:  Ext warm, no cyanosis apprec.; cap RF less 2 sec. Psych:  No HI/SI, judgement and insight good, Euthymic mood. Full Affect.   BP 113/69   Pulse 66   Temp 98.8 F (37.1 C)   Ht 5' 5" (1.651 m)   Wt 210 lb 14.4 oz (95.7 kg)   SpO2 100%   BMI 35.10 kg/m  Wt Readings from Last 3 Encounters:  09/30/20 210 lb 14.4 oz (95.7 kg)  08/12/20 216 lb 8 oz (98.2 kg)  07/28/20 220 lb 6.4 oz (100 kg)     Health Maintenance Due  Topic Date Due  . HIV Screening  Never done  . Hepatitis C Screening  Never done  . PAP-Cervical Cytology Screening  Never done  . PAP SMEAR-Modifier  Never done    There are no preventive care reminders to display for this patient.  Lab Results  Component Value Date   TSH 2.240 08/07/2020   Lab Results  Component Value Date   WBC 11.0 (H) 08/07/2020   HGB 13.8 08/07/2020   HCT 42.3 08/07/2020   MCV 85 08/07/2020   PLT 332 08/07/2020   Lab Results  Component Value Date   NA 137 08/07/2020   K 4.5 08/07/2020   CO2 16 (L) 08/07/2020   GLUCOSE 90 08/07/2020   BUN 8 08/07/2020   CREATININE 0.77 08/07/2020   BILITOT 0.2 08/07/2020   ALKPHOS 79 08/07/2020   AST 14 08/07/2020   ALT 11 08/07/2020   PROT 7.8 08/07/2020   ALBUMIN 4.4 08/07/2020   CALCIUM 9.3 08/07/2020   EGFR 108 08/07/2020   Lab Results  Component Value Date   CHOL 190 08/07/2020   Lab Results  Component Value Date   HDL 59 08/07/2020   Lab Results  Component Value Date   LDLCALC 113 (H) 08/07/2020   Lab Results  Component Value Date   TRIG 98 08/07/2020   Lab Results  Component  Value Date   CHOLHDL 3.2 08/07/2020   Lab Results  Component Value Date   HGBA1C 5.5 08/07/2020      Assessment & Plan:   Problem List Items Addressed This Visit      Other   GAD (generalized anxiety disorder)    Other Visit Diagnoses    Adult ADHD    -  Primary   Relevant Medications   ADDERALL XR 10 MG   24 hr capsule   Class 2 severe obesity with serious comorbidity and body mass index (BMI) of 35.0 to 35.9 in adult, unspecified obesity type (HCC)       Relevant Medications   ADDERALL XR 10 MG 24 hr capsule   Phentermine HCl 8 MG TABS   Other Relevant Orders   Amb ref to Medical Nutrition Therapy-MNT     Adult ADHD: -Improved. -PDMP reviewed, Adderall XR 10 mg last filled 07/28/2020.  Provided refill. -BP and pulse within normal limits. -Follow-up in 3 months for medication management.  Class II severe obesity with serious comorbidity and body mass index (BMI) of 35.0 to 35.9 in adult, unspecified obesity type: -Associated with hyperlipidemia. -Praised patient for 16 pound loss since last visit 08/12/20 (acute). -Encouraged to continue with dietary changes and recommend referral to nutritionist, patient is agreeable.  Recommend to increase physical activity with ultimate goal of 150 minutes/week of moderate aerobic exercise. -Saxenda was denied by insurance and patient non-compliant with phentermine-topiramate so will trial low-dose of phentermine. Discussed with patient phentermine therapy is short-term, 12 weeks.  Advised to discontinue medication if starts experiencing side effects including mood changes or cardiac symptoms. -Follow-up in 4 weeks for weight management.  GAD: -PHQ-9 of 5, stable. -Continue current medication regimen. -Will continue to monitor.   Meds ordered this encounter  Medications  . ADDERALL XR 10 MG 24 hr capsule    Sig: Take 1 capsule (10 mg total) by mouth daily.    Dispense:  30 capsule    Refill:  0    Order Specific Question:    Supervising Provider    Answer:   METHENEY, CATHERINE D [[id number]]  . Phentermine HCl 8 MG TABS    Sig: Take 1 tablet by mouth daily.    Dispense:  30 tablet    Refill:  0    Order Specific Question:   Supervising Provider    Answer:   METHENEY, CATHERINE D [[id number]]    Follow-up: Return in about 4 weeks (around 10/28/2020) for Wt; 3 month follow up for ADHD, Mood.   Note:  This note was prepared with assistance of Dragon voice recognition software. Occasional wrong-word or sound-a-like substitutions may have occurred due to the inherent limitations of voice recognition software.  Maritza Abonza, PA-C 

## 2020-09-30 NOTE — Patient Instructions (Signed)
Calorie Counting for Weight Loss Calories are units of energy. Your body needs a certain number of calories from food to keep going throughout the day. When you eat or drink more calories than your body needs, your body stores the extra calories mostly as fat. When you eat or drink fewer calories than your body needs, your body burns fat to get the energy it needs. Calorie counting means keeping track of how many calories you eat and drink each day. Calorie counting can be helpful if you need to lose weight. If you eat fewer calories than your body needs, you should lose weight. Ask your health care provider what a healthy weight is for you. For calorie counting to work, you will need to eat the right number of calories each day to lose a healthy amount of weight per week. A dietitian can help you figure out how many calories you need in a day and will suggest ways to reach your calorie goal.  A healthy amount of weight to lose each week is usually 1-2 lb (0.5-0.9 kg). This usually means that your daily calorie intake should be reduced by 500-750 calories.  Eating 1,200-1,500 calories a day can help most women lose weight.  Eating 1,500-1,800 calories a day can help most men lose weight. What do I need to know about calorie counting? Work with your health care provider or dietitian to determine how many calories you should get each day. To meet your daily calorie goal, you will need to:  Find out how many calories are in each food that you would like to eat. Try to do this before you eat.  Decide how much of the food you plan to eat.  Keep a food log. Do this by writing down what you ate and how many calories it had. To successfully lose weight, it is important to balance calorie counting with a healthy lifestyle that includes regular activity. Where do I find calorie information? The number of calories in a food can be found on a Nutrition Facts label. If a food does not have a Nutrition Facts  label, try to look up the calories online or ask your dietitian for help. Remember that calories are listed per serving. If you choose to have more than one serving of a food, you will have to multiply the calories per serving by the number of servings you plan to eat. For example, the label on a package of bread might say that a serving size is 1 slice and that there are 90 calories in a serving. If you eat 1 slice, you will have eaten 90 calories. If you eat 2 slices, you will have eaten 180 calories.   How do I keep a food log? After each time that you eat, record the following in your food log as soon as possible:  What you ate. Be sure to include toppings, sauces, and other extras on the food.  How much you ate. This can be measured in cups, ounces, or number of items.  How many calories were in each food and drink.  The total number of calories in the food you ate. Keep your food log near you, such as in a pocket-sized notebook or on an app or website on your mobile phone. Some programs will calculate calories for you and show you how many calories you have left to meet your daily goal. What are some portion-control tips?  Know how many calories are in a serving. This will   help you know how many servings you can have of a certain food.  Use a measuring cup to measure serving sizes. You could also try weighing out portions on a kitchen scale. With time, you will be able to estimate serving sizes for some foods.  Take time to put servings of different foods on your favorite plates or in your favorite bowls and cups so you know what a serving looks like.  Try not to eat straight from a food's packaging, such as from a bag or box. Eating straight from the package makes it hard to see how much you are eating and can lead to overeating. Put the amount you would like to eat in a cup or on a plate to make sure you are eating the right portion.  Use smaller plates, glasses, and bowls for smaller  portions and to prevent overeating.  Try not to multitask. For example, avoid watching TV or using your computer while eating. If it is time to eat, sit down at a table and enjoy your food. This will help you recognize when you are full. It will also help you be more mindful of what and how much you are eating. What are tips for following this plan? Reading food labels  Check the calorie count compared with the serving size. The serving size may be smaller than what you are used to eating.  Check the source of the calories. Try to choose foods that are high in protein, fiber, and vitamins, and low in saturated fat, trans fat, and sodium. Shopping  Read nutrition labels while you shop. This will help you make healthy decisions about which foods to buy.  Pay attention to nutrition labels for low-fat or fat-free foods. These foods sometimes have the same number of calories or more calories than the full-fat versions. They also often have added sugar, starch, or salt to make up for flavor that was removed with the fat.  Make a grocery list of lower-calorie foods and stick to it. Cooking  Try to cook your favorite foods in a healthier way. For example, try baking instead of frying.  Use low-fat dairy products. Meal planning  Use more fruits and vegetables. One-half of your plate should be fruits and vegetables.  Include lean proteins, such as chicken, turkey, and fish. Lifestyle Each week, aim to do one of the following:  150 minutes of moderate exercise, such as walking.  75 minutes of vigorous exercise, such as running. General information  Know how many calories are in the foods you eat most often. This will help you calculate calorie counts faster.  Find a way of tracking calories that works for you. Get creative. Try different apps or programs if writing down calories does not work for you. What foods should I eat?  Eat nutritious foods. It is better to have a nutritious,  high-calorie food, such as an avocado, than a food with few nutrients, such as a bag of potato chips.  Use your calories on foods and drinks that will fill you up and will not leave you hungry soon after eating. ? Examples of foods that fill you up are nuts and nut butters, vegetables, lean proteins, and high-fiber foods such as whole grains. High-fiber foods are foods with more than 5 g of fiber per serving.  Pay attention to calories in drinks. Low-calorie drinks include water and unsweetened drinks. The items listed above may not be a complete list of foods and beverages you can eat.   Contact a dietitian for more information.   What foods should I limit? Limit foods or drinks that are not good sources of vitamins, minerals, or protein or that are high in unhealthy fats. These include:  Candy.  Other sweets.  Sodas, specialty coffee drinks, alcohol, and juice. The items listed above may not be a complete list of foods and beverages you should avoid. Contact a dietitian for more information. How do I count calories when eating out?  Pay attention to portions. Often, portions are much larger when eating out. Try these tips to keep portions smaller: ? Consider sharing a meal instead of getting your own. ? If you get your own meal, eat only half of it. Before you start eating, ask for a container and put half of your meal into it. ? When available, consider ordering smaller portions from the menu instead of full portions.  Pay attention to your food and drink choices. Knowing the way food is cooked and what is included with the meal can help you eat fewer calories. ? If calories are listed on the menu, choose the lower-calorie options. ? Choose dishes that include vegetables, fruits, whole grains, low-fat dairy products, and lean proteins. ? Choose items that are boiled, broiled, grilled, or steamed. Avoid items that are buttered, battered, fried, or served with cream sauce. Items labeled as  crispy are usually fried, unless stated otherwise. ? Choose water, low-fat milk, unsweetened iced tea, or other drinks without added sugar. If you want an alcoholic beverage, choose a lower-calorie option, such as a glass of wine or light beer. ? Ask for dressings, sauces, and syrups on the side. These are usually high in calories, so you should limit the amount you eat. ? If you want a salad, choose a garden salad and ask for grilled meats. Avoid extra toppings such as bacon, cheese, or fried items. Ask for the dressing on the side, or ask for olive oil and vinegar or lemon to use as dressing.  Estimate how many servings of a food you are given. Knowing serving sizes will help you be aware of how much food you are eating at restaurants. Where to find more information  Centers for Disease Control and Prevention: www.cdc.gov  [place] Department of Agriculture: myplate.gov Summary  Calorie counting means keeping track of how many calories you eat and drink each day. If you eat fewer calories than your body needs, you should lose weight.  A healthy amount of weight to lose per week is usually 1-2 lb (0.5-0.9 kg). This usually means reducing your daily calorie intake by 500-750 calories.  The number of calories in a food can be found on a Nutrition Facts label. If a food does not have a Nutrition Facts label, try to look up the calories online or ask your dietitian for help.  Use smaller plates, glasses, and bowls for smaller portions and to prevent overeating.  Use your calories on foods and drinks that will fill you up and not leave you hungry shortly after a meal. This information is not intended to replace advice given to you by your health care provider. Make sure you discuss any questions you have with your health care provider. Document Revised: 06/13/2019 Document Reviewed: 06/13/2019 Elsevier Patient Education  2021 Elsevier Inc.  

## 2020-10-05 ENCOUNTER — Other Ambulatory Visit: Payer: Self-pay | Admitting: Physician Assistant

## 2020-10-06 ENCOUNTER — Encounter: Payer: Self-pay | Admitting: Physician Assistant

## 2020-10-06 ENCOUNTER — Other Ambulatory Visit: Payer: Self-pay | Admitting: Family Medicine

## 2020-10-06 ENCOUNTER — Telehealth: Payer: Self-pay | Admitting: Physician Assistant

## 2020-10-06 DIAGNOSIS — Z6833 Body mass index (BMI) 33.0-33.9, adult: Secondary | ICD-10-CM

## 2020-10-06 DIAGNOSIS — R635 Abnormal weight gain: Secondary | ICD-10-CM

## 2020-10-06 NOTE — Telephone Encounter (Signed)
Please contact patient to advise PA for Phentermine was denied by her insurance company. Please advise patient to contact her insurance company and see what medication, if any, her insurance will cover for weight loss.

## 2020-10-06 NOTE — Telephone Encounter (Signed)
Please disregard. Entered in error. - WA, CMA

## 2020-10-06 NOTE — Telephone Encounter (Signed)
Please discuss this with Maritza. AS, CMA

## 2020-10-06 NOTE — Telephone Encounter (Signed)
Patient states the pharmacy needs a pre-authorization for her Phentermine. Patient uses [facility], thanks.

## 2020-10-06 NOTE — Telephone Encounter (Signed)
Spoke with PCP about PA and she recommend the patient to reach out to insurance company to investigate why the PA was denied.

## 2020-10-06 NOTE — Telephone Encounter (Signed)
Called patient to discuss denied PA and contacting her insurance company for alternatives. She states she has called [employer] and they gave her a list of non weight management medications. I strongly encouraged patient to reach out again to her insurance company to obtain another list. She also said her insurance has paid for this medication before with no issue. She is concerned the name of the medication appearing on the PA is causing it to be denied. Told patient I would discuss this with her PCP and call her back with an update. - WA, CMA

## 2020-10-06 NOTE — Telephone Encounter (Signed)
Can you please reach out to patient and schedule them for this Friday at 8:15am? Please ask the patient to be here at 8am. Maritza has approved the appointment. -WA, CMA 

## 2020-10-07 NOTE — Telephone Encounter (Signed)
Attempted to contact patients insurance. They stated they were having system issues and for me to call back in the next hour. AS, CMA

## 2020-10-28 ENCOUNTER — Ambulatory Visit: Payer: 59 | Admitting: Physician Assistant

## 2020-12-03 ENCOUNTER — Other Ambulatory Visit: Payer: Self-pay | Admitting: Physician Assistant

## 2020-12-03 DIAGNOSIS — F411 Generalized anxiety disorder: Secondary | ICD-10-CM

## 2021-03-19 ENCOUNTER — Encounter: Payer: Self-pay | Admitting: Physician Assistant

## 2021-04-10 DIAGNOSIS — R0981 Nasal congestion: Secondary | ICD-10-CM | POA: Diagnosis not present

## 2021-04-10 DIAGNOSIS — J019 Acute sinusitis, unspecified: Secondary | ICD-10-CM | POA: Diagnosis not present

## 2021-04-10 DIAGNOSIS — R0982 Postnasal drip: Secondary | ICD-10-CM | POA: Diagnosis not present

## 2021-04-10 DIAGNOSIS — J3489 Other specified disorders of nose and nasal sinuses: Secondary | ICD-10-CM | POA: Diagnosis not present

## 2021-07-06 NOTE — Progress Notes (Unsigned)
Established Patient Office Visit  Subjective:  Patient ID: Kelsey Ayers, female    DOB: 1995/08/03  Age: 26 y.o. MRN: [id number]  CC: No chief complaint on file.   HPI Kelsey Ayers presents for ***  Past Medical History:  Diagnosis Date   Allergy     Past Surgical History:  Procedure Laterality Date   FOOT SURGERY Right    WISDOM TOOTH EXTRACTION      Family History  Problem Relation Age of Onset   Hypertension Father    Diabetes Sister     Social History   Socioeconomic History   Marital status: Single    Spouse name: Not on file   Number of children: Not on file   Years of education: Not on file   Highest education level: Not on file  Occupational History   Not on file  Tobacco Use   Smoking status: Never   Smokeless tobacco: Never  Vaping Use   Vaping Use: Never used  Substance and Sexual Activity   Alcohol use: Yes    Alcohol/week: 1.0 standard drink    Types: 1 Glasses of wine per week   Drug use: No   Sexual activity: Never    Birth control/protection: None  Other Topics Concern   Not on file  Social History Narrative   Not on file   Social Determinants of Health   Financial Resource Strain: Not on file  Food Insecurity: Not on file  Transportation Needs: Not on file  Physical Activity: Not on file  Stress: Not on file  Social Connections: Not on file  Intimate Partner Violence: Not on file    Outpatient Medications Prior to Visit  Medication Sig Dispense Refill   ADDERALL XR 10 MG 24 hr capsule Take 1 capsule (10 mg total) by mouth daily. 30 capsule 0   buPROPion (WELLBUTRIN XL) 300 MG 24 hr tablet TAKE 1 TABLET BY MOUTH DAILY 90 tablet 0   FLUoxetine (PROZAC) 40 MG capsule Take 1 capsule (40 mg total) by mouth daily. 90 capsule 0   ipratropium (ATROVENT) 0.03 % nasal spray Place 2 sprays into both nostrils every 12 (twelve) hours. 30 mL 0   Phentermine HCl 8 MG TABS Take 1 tablet by mouth daily. 30 tablet 0   topiramate (TOPAMAX)  25 MG tablet TAKE 1 TABLET BY MOUTH DAILY 30 tablet 0   No facility-administered medications prior to visit.    Allergies  Allergen Reactions   Other Itching    Cats, dogs    ROS Review of Systems    Objective:    Physical Exam  There were no vitals taken for this visit. Wt Readings from Last 3 Encounters:  09/30/20 210 lb 14.4 oz (95.7 kg)  08/12/20 216 lb 8 oz (98.2 kg)  07/28/20 220 lb 6.4 oz (100 kg)     Health Maintenance Due  Topic Date Due   HIV Screening  Never done   Hepatitis C Screening  Never done   PAP-Cervical Cytology Screening  Never done   PAP SMEAR-Modifier  Never done   COVID-19 Vaccine (3 - Booster for Pfizer series) 07/30/2020   INFLUENZA VACCINE  12/14/2020    There are no preventive care reminders to display for this patient.  Lab Results  Component Value Date   TSH 2.240 08/07/2020   Lab Results  Component Value Date   WBC 11.0 (H) 08/07/2020   HGB 13.8 08/07/2020   HCT 42.3 08/07/2020   MCV 85  08/07/2020   PLT 332 08/07/2020   Lab Results  Component Value Date   NA 137 08/07/2020   K 4.5 08/07/2020   CO2 16 (L) 08/07/2020   GLUCOSE 90 08/07/2020   BUN 8 08/07/2020   CREATININE 0.77 08/07/2020   BILITOT 0.2 08/07/2020   ALKPHOS 79 08/07/2020   AST 14 08/07/2020   ALT 11 08/07/2020   PROT 7.8 08/07/2020   ALBUMIN 4.4 08/07/2020   CALCIUM 9.3 08/07/2020   EGFR 108 08/07/2020   Lab Results  Component Value Date   CHOL 190 08/07/2020   Lab Results  Component Value Date   HDL 59 08/07/2020   Lab Results  Component Value Date   LDLCALC 113 (H) 08/07/2020   Lab Results  Component Value Date   TRIG 98 08/07/2020   Lab Results  Component Value Date   CHOLHDL 3.2 08/07/2020   Lab Results  Component Value Date   HGBA1C 5.5 08/07/2020      Assessment & Plan:   Problem List Items Addressed This Visit   None   No orders of the defined types were placed in this encounter.   Follow-up: No follow-ups on  file.    Aron Baba, [place]

## 2021-07-08 ENCOUNTER — Ambulatory Visit: Payer: 59 | Admitting: Physician Assistant

## 2021-07-12 ENCOUNTER — Other Ambulatory Visit: Payer: Self-pay

## 2021-07-12 ENCOUNTER — Ambulatory Visit: Payer: BC Managed Care – PPO | Admitting: Physician Assistant

## 2021-07-12 ENCOUNTER — Encounter: Payer: Self-pay | Admitting: Physician Assistant

## 2021-07-12 DIAGNOSIS — L309 Dermatitis, unspecified: Secondary | ICD-10-CM | POA: Diagnosis not present

## 2021-07-12 DIAGNOSIS — Z6836 Body mass index (BMI) 36.0-36.9, adult: Secondary | ICD-10-CM | POA: Diagnosis not present

## 2021-07-12 MED ORDER — SAXENDA 18 MG/3ML ~~LOC~~ SOPN: PEN_INJECTOR | SUBCUTANEOUS | 0 refills | Status: DC

## 2021-07-12 NOTE — Progress Notes (Signed)
Established Patient Office Visit  Subjective:  Patient ID: Kelsey Ayers, female    DOB: 01-02-1996  Age: 26 y.o. MRN: [id number]  CC:  Chief Complaint  Patient presents with   Weight Gain    HPI Kelsey Ayers presents for discussion of weight. Patient reports has been struggling with weight loss. States has improved with not eating as much fast food and mostly drinks water and coffee. Cooks more at home and trying to eat more vegetables. Just started going to the gym. Tries to go everyday and mostly does cardio. Patient also reports hx of eczema. Has noticed more flare-ups.   Past Medical History:  Diagnosis Date   Allergy     Past Surgical History:  Procedure Laterality Date   FOOT SURGERY Right    WISDOM TOOTH EXTRACTION      Family History  Problem Relation Age of Onset   Hypertension Father    Diabetes Sister     Social History   Socioeconomic History   Marital status: Single    Spouse name: Not on file   Number of children: Not on file   Years of education: Not on file   Highest education level: Not on file  Occupational History   Not on file  Tobacco Use   Smoking status: Never   Smokeless tobacco: Never  Vaping Use   Vaping Use: Never used  Substance and Sexual Activity   Alcohol use: Yes    Alcohol/week: 1.0 standard drink    Types: 1 Glasses of wine per week   Drug use: No   Sexual activity: Never    Birth control/protection: None  Other Topics Concern   Not on file  Social History Narrative   Not on file   Social Determinants of Health   Financial Resource Strain: Not on file  Food Insecurity: Not on file  Transportation Needs: Not on file  Physical Activity: Not on file  Stress: Not on file  Social Connections: Not on file  Intimate Partner Violence: Not on file    Outpatient Medications Prior to Visit  Medication Sig Dispense Refill   FLUoxetine (PROZAC) 40 MG capsule Take 1 capsule (40 mg total) by mouth daily. 90 capsule 0    ADDERALL XR 10 MG 24 hr capsule Take 1 capsule (10 mg total) by mouth daily. (Patient not taking: Reported on 07/12/2021) 30 capsule 0   buPROPion (WELLBUTRIN XL) 300 MG 24 hr tablet TAKE 1 TABLET BY MOUTH DAILY (Patient not taking: Reported on 07/12/2021) 90 tablet 0   ipratropium (ATROVENT) 0.03 % nasal spray Place 2 sprays into both nostrils every 12 (twelve) hours. (Patient not taking: Reported on 07/12/2021) 30 mL 0   Phentermine HCl 8 MG TABS Take 1 tablet by mouth daily. (Patient not taking: Reported on 07/12/2021) 30 tablet 0   topiramate (TOPAMAX) 25 MG tablet TAKE 1 TABLET BY MOUTH DAILY (Patient not taking: Reported on 07/12/2021) 30 tablet 0   No facility-administered medications prior to visit.    Allergies  Allergen Reactions   Other Itching    Cats, dogs    ROS Review of Systems Review of Systems:  A fourteen system review of systems was performed and found to be positive as per HPI.   Objective:    Physical Exam General:  Well Developed, well nourished, appropriate for stated age.  Neuro:  Alert and oriented,  extra-ocular muscles intact  HEENT:  Normocephalic, atraumatic, neck supple  Skin: small erythematous scaly lesion noted  on left forearm Cardiac:  RRR, S1 S2 Respiratory: CTA B/L  Vascular:  Ext warm, no cyanosis apprec.; cap RF less 2 sec. Psych:  No HI/SI, judgement and insight good, Euthymic mood. Full Affect.  BP 124/80    Pulse 84    Temp 98 F (36.7 C)    Ht _0  (1.651 m)    Wt 221 lb (100.2 kg)    SpO2 98%    BMI 36.78 kg/m  Wt Readings from Last 3 Encounters:  07/12/21 221 lb (100.2 kg)  09/30/20 210 lb 14.4 oz (95.7 kg)  08/12/20 216 lb 8 oz (98.2 kg)     Health Maintenance Due  Topic Date Due   HIV Screening  Never done   Hepatitis C Screening  Never done   PAP-Cervical Cytology Screening  Never done   PAP SMEAR-Modifier  Never done   COVID-19 Vaccine (3 - Booster for Pfizer series) 07/30/2020   INFLUENZA VACCINE  12/14/2020    There  are no preventive care reminders to display for this patient.  Lab Results  Component Value Date   TSH 2.240 08/07/2020   Lab Results  Component Value Date   WBC 11.0 (H) 08/07/2020   HGB 13.8 08/07/2020   HCT 42.3 08/07/2020   MCV 85 08/07/2020   PLT 332 08/07/2020   Lab Results  Component Value Date   NA 137 08/07/2020   K 4.5 08/07/2020   CO2 16 (L) 08/07/2020   GLUCOSE 90 08/07/2020   BUN 8 08/07/2020   CREATININE 0.77 08/07/2020   BILITOT 0.2 08/07/2020   ALKPHOS 79 08/07/2020   AST 14 08/07/2020   ALT 11 08/07/2020   PROT 7.8 08/07/2020   ALBUMIN 4.4 08/07/2020   CALCIUM 9.3 08/07/2020   EGFR 108 08/07/2020   Lab Results  Component Value Date   CHOL 190 08/07/2020   Lab Results  Component Value Date   HDL 59 08/07/2020   Lab Results  Component Value Date   LDLCALC 113 (H) 08/07/2020   Lab Results  Component Value Date   TRIG 98 08/07/2020   Lab Results  Component Value Date   CHOLHDL 3.2 08/07/2020   Lab Results  Component Value Date   HGBA1C 5.5 08/07/2020      Assessment & Plan:   Problem List Items Addressed This Visit   None Visit Diagnoses     Class 2 severe obesity with serious comorbidity and body mass index (BMI) of 36.0 to 36.9 in adult, unspecified obesity type ([place])    -  Primary   Relevant Medications   Liraglutide -Weight Management (SAXENDA) 18 MG/3ML SOPN   Eczema, unspecified type          Class 2 severe obesity with serious comorbidity and body mass index (BMI) of 36.0 to 36.9 in adult, unspecified obesity type: -Associated with hyperlipidemia. -In the past patient has tried Phentermine, Qsymia and Mediterranean diet without long-term success. Patient will benefit from long-term weight loss medication in conjunction with dietary and lifestyle changes. Will start Saxenda. Recommend to follow-up in 4 weeks for medication management. If Kirke Shaggy is not approved by insurance then will consider low dose of phentermine.    Eczema: -Offered topical corticosteroid, patient prefers to trial OTC cortisone cream. Discussed skin emollient.     Meds ordered this encounter  Medications   Liraglutide -Weight Management (SAXENDA) 18 MG/3ML SOPN    Sig: Inject 0.6 mg into the skin once daily x 1 wk. Then increase dose by 0.6  mg/day every 7 days to target of 3 mg/day.    Dispense:  15 mL    Refill:  0    Order Specific Question:   Supervising Provider    Answer:   Beatrice Lecher D [[id number]    Follow-up: Return in about 4 weeks (around 08/09/2021) for Weight- pt will schedule once she started medication .    Lorrene Reid, PA-C

## 2021-07-12 NOTE — Patient Instructions (Signed)

## 2021-07-15 ENCOUNTER — Encounter: Payer: Self-pay | Admitting: Physician Assistant

## 2021-08-02 ENCOUNTER — Encounter: Payer: Self-pay | Admitting: Physician Assistant

## 2021-08-02 MED ORDER — PHENTERMINE HCL 15 MG PO CAPS: 15.0000 mg | ORAL_CAPSULE | ORAL | 0 refills | Status: DC

## 2021-09-07 ENCOUNTER — Encounter: Payer: Self-pay | Admitting: Physician Assistant

## 2021-09-07 ENCOUNTER — Ambulatory Visit (INDEPENDENT_AMBULATORY_CARE_PROVIDER_SITE_OTHER): Payer: BC Managed Care – PPO | Admitting: Physician Assistant

## 2021-09-07 VITALS — BP 122/82 | HR 72 | Temp 98.1°F | Ht 64.5 in | Wt 218.0 lb

## 2021-09-07 DIAGNOSIS — Z6836 Body mass index (BMI) 36.0-36.9, adult: Secondary | ICD-10-CM | POA: Diagnosis not present

## 2021-09-07 DIAGNOSIS — J302 Other seasonal allergic rhinitis: Secondary | ICD-10-CM

## 2021-09-07 MED ORDER — PHENTERMINE HCL 30 MG PO CAPS: 30.0000 mg | ORAL_CAPSULE | ORAL | 0 refills | Status: DC

## 2021-09-07 MED ORDER — FEXOFENADINE-PSEUDOEPHED ER 60-120 MG PO TB12: 1.0000 | ORAL_TABLET | Freq: Two times a day (BID) | ORAL | 0 refills | Status: DC

## 2021-09-07 NOTE — Progress Notes (Signed)
? ?Established Patient Office Visit ? ?Subjective   ?Patient ID: Kelsey Ayers, female    DOB: 22-Jul-1995  Age: 26 y.o. MRN: [id number] ? ?Chief Complaint  ?Patient presents with  ? Follow-up  ? ? ?HPI ? ?Patient presents for follow-up on weight management. Patient tolerating Phentermine 15 mg without major issues. Has noticed dry mouth and mild headache. Denies palpitations or chest pain. States has always had issues with sleep, nothing worse. Has not noticed much of appetite suppressant. Reports has been experiencing sinus pressure and nose is tender to the touch. Feeling like something is stuck in her throat. No dysphagia, fever or chills. Taking Allegra. ? ?Patient Active Problem List  ? Diagnosis Date Noted  ? Abnormal weight gain 03/28/2019  ? BMI 33.0-33.9,adult 07/10/2018  ? GAD (generalized anxiety disorder) 06/06/2018  ? Abdominal pain, acute 01/23/2018  ? Strep pharyngitis 09/14/2017  ? Healthcare maintenance 05/04/2017  ? Seasonal allergies 05/04/2017  ? Cough 05/04/2017  ? ?Past Medical History:  ?Diagnosis Date  ? Allergy   ? ?Past Surgical History:  ?Procedure Laterality Date  ? FOOT SURGERY Right   ? WISDOM TOOTH EXTRACTION    ? ?Social History  ? ?Tobacco Use  ? Smoking status: Never  ? Smokeless tobacco: Never  ?Vaping Use  ? Vaping Use: Never used  ?Substance Use Topics  ? Alcohol use: Yes  ?  Alcohol/week: 1.0 standard drink  ?  Types: 1 Glasses of wine per week  ? Drug use: No  ? ?Allergies  ?Allergen Reactions  ? Other Itching  ?  Cats, dogs  ? ? ? ?ROS ?Review of Systems:  ?A fourteen system review of systems was performed and found to be positive as per HPI. ?  ?Objective:  ?  ? ?BP 122/82   Pulse 72   Temp 98.1 ?F (36.7 ?C)   Ht 5' 4.5" (1.638 m)   Wt 218 lb (98.9 kg)   SpO2 98%   BMI 36.84 kg/m?  ?BP Readings from Last 3 Encounters:  ?09/07/21 122/82  ?07/12/21 124/80  ?09/30/20 113/69  ? ?Wt Readings from Last 3 Encounters:  ?09/07/21 218 lb (98.9 kg)  ?07/12/21 221 lb (100.2 kg)   ?09/30/20 210 lb 14.4 oz (95.7 kg)  ? ? ?Physical Exam ?General:  Well Developed, well nourished, appropriate for stated age.  ?Neuro:  Alert and oriented,  extra-ocular muscles intact  ?HEENT:  Normocephalic, atraumatic, ethmoid sinus tenderness, normal TM's of both ears, clear rhinorrhea, mild erythema of posterior oropharynx, no tonsillar swelling, no exudates, neck supple, no adenopathy   ?Skin:  no gross rash, warm, pink. ?Cardiac:  RRR, S1 S2 ?Respiratory: CTA B/L  ?Vascular:  Ext warm, no cyanosis apprec.; cap RF less 2 sec. ?Psych:  No HI/SI, judgement and insight good, Euthymic mood. Full Affect. ? ? ?No results found for any visits on 09/07/21. ? ?Last CBC ?Lab Results  ?Component Value Date  ? WBC 11.0 (H) 08/07/2020  ? HGB 13.8 08/07/2020  ? HCT 42.3 08/07/2020  ? MCV 85 08/07/2020  ? MCH 27.7 08/07/2020  ? RDW 13.0 08/07/2020  ? PLT 332 08/07/2020  ? ?Last metabolic panel ?Lab Results  ?Component Value Date  ? GLUCOSE 90 08/07/2020  ? NA 137 08/07/2020  ? K 4.5 08/07/2020  ? CL 104 08/07/2020  ? CO2 16 (L) 08/07/2020  ? BUN 8 08/07/2020  ? CREATININE 0.77 08/07/2020  ? EGFR 108 08/07/2020  ? CALCIUM 9.3 08/07/2020  ? PROT 7.8 08/07/2020  ?  ALBUMIN 4.4 08/07/2020  ? LABGLOB 3.4 08/07/2020  ? AGRATIO 1.3 08/07/2020  ? BILITOT 0.2 08/07/2020  ? ALKPHOS 79 08/07/2020  ? AST 14 08/07/2020  ? ALT 11 08/07/2020  ? ?Last lipids ?Lab Results  ?Component Value Date  ? CHOL 190 08/07/2020  ? HDL 59 08/07/2020  ? LDLCALC 113 (H) 08/07/2020  ? TRIG 98 08/07/2020  ? CHOLHDL 3.2 08/07/2020  ? ?Last hemoglobin A1c ?Lab Results  ?Component Value Date  ? HGBA1C 5.5 08/07/2020  ? ?Last thyroid functions ?Lab Results  ?Component Value Date  ? TSH 2.240 08/07/2020  ? ?Last vitamin D ?No results found for: 25OHVITD2, 25OHVITD3, VD25OH ?  ? ?The ASCVD Risk score (Arnett DK, et al., 2019) failed to calculate for the following reasons: ?  The 2019 ASCVD risk score is only valid for ages 14 to 80 ? ?  ?Assessment & Plan:   ? ?Problem List Items Addressed This Visit   ? ?  ? Other  ? Seasonal allergies  ? Relevant Medications  ? fexofenadine-pseudoephedrine (ALLEGRA-D) 60-120 MG 12 hr tablet  ? ?Other Visit Diagnoses   ? ? Class 2 severe obesity with serious comorbidity and body mass index (BMI) of 36.0 to 36.9 in adult, unspecified obesity type ([place])    -  Primary  ? Relevant Medications  ? phentermine 30 MG capsule  ? ?  ? ?Class 2 severe obesity with serious comorbidity and body mass index (BMI) of 36.0 to 36.9 in adult, unspecified obesity type:  ?-Patient has lost about 3 pounds since starting medication therapy with phentermine 15 mg. Saxenda not approved by insurance. Will increase phentermine to 30 mg. Advised patient is unable to tolerate increased dose then recommend resuming 15 mg. Discussed the importance of changing diet habits and incorporating exercise. Will reassess weight and medication therapy in 4 weeks. ? ?Seasonal allergies: ?-Discussed with patient globus sensation likely secondary to allergies. Other etiologies include laryngotracheal reflux. Recommend to use nasal spray and will send rx for Allegra-D to take for a few days (3-5) and then can resume regular Allegra. ? ?Return in about 4 weeks (around 10/05/2021) for Wt- inc med.  ? ? ?Lorrene Reid, PA-C ? ?

## 2021-09-07 NOTE — Patient Instructions (Signed)
Allergies, Adult An allergy is a condition in which the body's defense system (immune system) comes in contact with an allergen and reacts to it. An allergen is anything that causes an allergic reaction. Allergens cause the immune system to make proteins for fighting infections (antibodies). These antibodies cause cells to release chemicals called histamines that set off the symptoms of an allergic reaction. Allergies often affect the nasal passages (allergic rhinitis), eyes (allergic conjunctivitis), skin (atopic dermatitis), and stomach. Allergies can be mild, moderate, or severe. They cannot spread from person to person. Allergies can develop at any age and may be outgrown. What are the causes? This condition is caused by allergens. Common allergens include: Outdoor allergens, such as pollen, car fumes, and mold. Indoor allergens, such as dust, smoke, mold, and pet dander. Other allergens, such as foods, medicines, scents, insect bites or stings, and other skin irritants. What increases the risk? You are more likely to develop this condition if you have: Family members with allergies. Family members who have any condition that may be caused by allergens, such as asthma. This may make you more likely to have other allergies. What are the signs or symptoms? Symptoms of this condition depend on the severity of the allergy. Mild to moderate symptoms Runny nose, stuffy nose (nasal congestion), or sneezing. Itchy mouth, ears, or throat. A feeling of mucus dripping down the back of your throat (postnasal drip). Sore throat. Itchy, red, watery, or puffy eyes. Skin rash, or itchy, red, swollen areas of skin (hives). Stomach cramps or bloating. Severe symptoms Severe allergies to food, medicine, or insect bites may cause anaphylaxis, which can be life-threatening. Symptoms include: A red (flushed) face. Wheezing or coughing. Swollen lips, tongue, or mouth. Tight or swollen throat. Chest pain or  tightness, or rapid heartbeat. Trouble breathing or shortness of breath. Pain in the abdomen, vomiting, or diarrhea. Dizziness or fainting. How is this diagnosed? This condition is diagnosed based on your symptoms, your family and medical history, and a physical exam. You may also have tests, including: Skin tests to see how your skin reacts to allergens that may be causing your symptoms. Tests include: Skin prick test. For this test, an allergen is introduced to your body through a small opening in the skin. Intradermal skin test. For this test, a small amount of allergen is injected under the first layer of your skin. Patch test. For this test, a small amount of allergen is placed on your skin. The area is covered and then checked after a few days. Blood tests. A challenge test. For this test, you will eat or breathe in a small amount of allergen to see if you have an allergic reaction. You may also be asked to: Keep a food diary. This is a record of all the foods, drinks, and symptoms you have in a day. Try an elimination diet. To do this: Remove certain foods from your diet. Add those foods back one by one to find out if any foods cause an allergic reaction. How is this treated?     Treatment for allergies depends on your symptoms. Treatment may include: Cold, wet cloths (cold compresses) to soothe itching and swelling. Eye drops or nasal sprays. Nasal irrigation to help clear your mucus or keep the nasal passages moist. A humidifier to add moisture to the air. Skin creams to treat rashes or itching. Oral antihistamines or other medicines to block the reaction or to treat inflammation. Diet changes to remove foods that cause allergies. Being   exposed again and again to tiny amounts of allergens to help you build a defense against it (tolerance). This is called immunotherapy. Examples include: Allergy shot. You receive an injection that contains an allergen. Sublingual immunotherapy.  You take a small dose of allergen under your tongue. Emergency injection for anaphylaxis. You give yourself a shot using a syringe (auto-injector) that contains the amount of medicine you need. Your health care provider will teach you how to give yourself an injection. Follow these instructions at home: Medicines  Take or apply over-the-counter and prescription medicines only as told by your health care provider. Always carry your auto-injector pen if you are at risk of anaphylaxis. Give yourself an injection as told by your health care provider. Eating and drinking Follow instructions from your health care provider about eating or drinking restrictions. Drink enough fluid to keep your urine pale yellow. General instructions Wear a medical alert bracelet or necklace to let others know that you have had anaphylaxis before. Avoid known allergens whenever possible. Keep all follow-up visits as told by your health care provider. This is important. Contact a health care provider if: Your symptoms do not get better with treatment. Get help right away if: You have symptoms of anaphylaxis. These include: Swollen mouth, tongue, or throat. Pain or tightness in your chest. Trouble breathing or shortness of breath. Dizziness or fainting. Severe abdominal pain, vomiting, or diarrhea. These symptoms may represent a serious problem that is an emergency. Do not wait to see if the symptoms will go away. Get medical help right away. Call your local emergency services (911 in the [place] Do not drive yourself to the hospital. Summary Take or apply over-the-counter and prescription medicines only as told by your health care provider. Avoid known allergens when possible. Always carry your auto-injector pen if you are at risk of anaphylaxis. Give yourself an injection as told by your health care provider. Wear a medical alert bracelet or necklace to let others know that you have had anaphylaxis  before. Anaphylaxis is a life-threatening emergency. Get help right away. This information is not intended to replace advice given to you by your health care provider. Make sure you discuss any questions you have with your health care provider. Document Revised: 12/30/2019 Document Reviewed: 03/13/2019 Elsevier Patient Education  2023 [employer]  

## 2021-09-15 ENCOUNTER — Encounter: Payer: Self-pay | Admitting: Physician Assistant

## 2021-09-23 ENCOUNTER — Encounter: Payer: Self-pay | Admitting: Physician Assistant

## 2021-09-27 ENCOUNTER — Ambulatory Visit (INDEPENDENT_AMBULATORY_CARE_PROVIDER_SITE_OTHER): Payer: BC Managed Care – PPO | Admitting: Physician Assistant

## 2021-09-27 ENCOUNTER — Encounter: Payer: Self-pay | Admitting: Physician Assistant

## 2021-09-27 VITALS — BP 110/72 | HR 73 | Temp 97.7°F | Ht 65.0 in | Wt 217.0 lb

## 2021-09-27 DIAGNOSIS — B351 Tinea unguium: Secondary | ICD-10-CM | POA: Diagnosis not present

## 2021-09-27 MED ORDER — CICLOPIROX 8 % EX SOLN: Freq: Every day | CUTANEOUS | 0 refills | Status: DC

## 2021-09-27 NOTE — Progress Notes (Signed)
?Established patient acute visit ? ? ?Patient: Kelsey Ayers   DOB: 1995/07/03   26 y.o. Female  MRN: [id number] ?Visit Date: 09/27/2021 ? ?Chief Complaint  ?Patient presents with  ? Nail Problem  ?   ?  ? ?Subjective  ?  ?HPI ?HPI   ? ? Nail Problem   ? Additional comments:  ? ? ?  ?  ?Last edited by Mickel Crow, CMA on 09/27/2021  3:28 PM.  ?  ?  ?Patient presents with c/o nail discoloration which she noticed 1 week ago. Denies tenderness, drainage, swelling, warmth or injury. First she though it was a bruise. Denies recent manicure. ? ? ? ?Medications: ?Outpatient Medications Prior to Visit  ?Medication Sig  ? BLISOVI FE 1/20 1-20 MG-MCG tablet Take 1 tablet by mouth daily.  ? fexofenadine (ALLEGRA) 180 MG tablet Take 180 mg by mouth daily.  ? FLUoxetine (PROZAC) 40 MG capsule Take 1 capsule (40 mg total) by mouth daily.  ? phentermine 30 MG capsule Take 1 capsule (30 mg total) by mouth every morning.  ? [DISCONTINUED] fexofenadine-pseudoephedrine (ALLEGRA-D) 60-120 MG 12 hr tablet Take 1 tablet by mouth 2 (two) times daily.  ? [DISCONTINUED] Liraglutide -Weight Management (SAXENDA) 18 MG/3ML SOPN Inject 0.6 mg into the skin once daily x 1 wk. Then increase dose by 0.6 mg/day every 7 days to target of 3 mg/day. (Patient not taking: Reported on 09/07/2021)  ? ?No facility-administered medications prior to visit.  ? ? ?Review of Systems ?Review of Systems:  ?A fourteen system review of systems was performed and found to be positive as per HPI. ? ?Last CBC ?Lab Results  ?Component Value Date  ? WBC 11.0 (H) 08/07/2020  ? HGB 13.8 08/07/2020  ? HCT 42.3 08/07/2020  ? MCV 85 08/07/2020  ? MCH 27.7 08/07/2020  ? RDW 13.0 08/07/2020  ? PLT 332 08/07/2020  ? ?Last metabolic panel ?Lab Results  ?Component Value Date  ? GLUCOSE 90 08/07/2020  ? NA 137 08/07/2020  ? K 4.5 08/07/2020  ? CL 104 08/07/2020  ? CO2 16 (L) 08/07/2020  ? BUN 8 08/07/2020  ? CREATININE 0.77 08/07/2020  ? EGFR 108 08/07/2020  ? CALCIUM 9.3  08/07/2020  ? PROT 7.8 08/07/2020  ? ALBUMIN 4.4 08/07/2020  ? LABGLOB 3.4 08/07/2020  ? AGRATIO 1.3 08/07/2020  ? BILITOT 0.2 08/07/2020  ? ALKPHOS 79 08/07/2020  ? AST 14 08/07/2020  ? ALT 11 08/07/2020  ? ?Last lipids ?Lab Results  ?Component Value Date  ? CHOL 190 08/07/2020  ? HDL 59 08/07/2020  ? LDLCALC 113 (H) 08/07/2020  ? TRIG 98 08/07/2020  ? CHOLHDL 3.2 08/07/2020  ? ?Last hemoglobin A1c ?Lab Results  ?Component Value Date  ? HGBA1C 5.5 08/07/2020  ? ?Last thyroid functions ?Lab Results  ?Component Value Date  ? TSH 2.240 08/07/2020  ? ?Last vitamin D ?No results found for: 25OHVITD2, [place], VD25OH ?  Objective  ?  ?BP 110/72   Pulse 73   Temp 97.7 ?F (36.5 ?C)   Ht $R'5\' 5"'qO$  (1.651 m)   Wt 217 lb (98.4 kg)   SpO2 99%   BMI 36.11 kg/m?  ? ? ?Physical Exam  ?General:  Well Developed, well nourished, appropriate for stated age.  ?Neuro:  Alert and oriented,  extra-ocular muscles intact  ?HEENT:  Normocephalic, atraumatic, neck supple  ?Skin:  dark discoloration at nail bed of right middle finger with central yellow discoloration ?Cardiac:  RRR, S1 S2 ?Respiratory: CTA B/L  ?  Vascular:  Ext warm, no cyanosis apprec.; cap RF less 2 sec. ?Psych:  No HI/SI, judgement and insight good, Euthymic mood. Full Affect. ?  ? ?No results found for any visits on 09/27/21. ? Assessment & Plan  ?  ? ?Patient has s/sx consistent with nail fungal infection so will start topical antifungal with Penlac 8%. Provided handout. Discussed oral antifungal if fails topical antifungal therapy. Follow-up prn.  ? ? ?Return if symptoms worsen or fail to improve.  ?   ? ? ? ?Lorrene Reid, PA-C  ?[facility] ?[phone] (phone) ?[phone] (fax) ? ?[facility] ?

## 2021-09-27 NOTE — Patient Instructions (Signed)

## 2021-10-01 NOTE — Patient Instructions (Incomplete)
Calorie Counting for Weight Loss Calories are units of energy. Your body needs a certain number of calories from food to keep going throughout the day. When you eat or drink more calories than your body needs, your body stores the extra calories mostly as fat. When you eat or drink fewer calories than your body needs, your body burns fat to get the energy it needs. Calorie counting means keeping track of how many calories you eat and drink each day. Calorie counting can be helpful if you need to lose weight. If you eat fewer calories than your body needs, you should lose weight. Ask your health care provider what a healthy weight is for you. For calorie counting to work, you will need to eat the right number of calories each day to lose a healthy amount of weight per week. A dietitian can help you figure out how many calories you need in a day and will suggest ways to reach your calorie goal. A healthy amount of weight to lose each week is usually 1-2 lb (0.5-0.9 kg). This usually means that your daily calorie intake should be reduced by 500-750 calories. Eating 1,200-1,500 calories a day can help most women lose weight. Eating 1,500-1,800 calories a day can help most men lose weight. What do I need to know about calorie counting? Work with your health care provider or dietitian to determine how many calories you should get each day. To meet your daily calorie goal, you will need to: Find out how many calories are in each food that you would like to eat. Try to do this before you eat. Decide how much of the food you plan to eat. Keep a food log. Do this by writing down what you ate and how many calories it had. To successfully lose weight, it is important to balance calorie counting with a healthy lifestyle that includes regular activity. Where do I find calorie information?  The number of calories in a food can be found on a Nutrition Facts label. If a food does not have a Nutrition Facts label, try  to look up the calories online or ask your dietitian for help. Remember that calories are listed per serving. If you choose to have more than one serving of a food, you will have to multiply the calories per serving by the number of servings you plan to eat. For example, the label on a package of bread might say that a serving size is 1 slice and that there are 90 calories in a serving. If you eat 1 slice, you will have eaten 90 calories. If you eat 2 slices, you will have eaten 180 calories. How do I keep a food log? After each time that you eat, record the following in your food log as soon as possible: What you ate. Be sure to include toppings, sauces, and other extras on the food. How much you ate. This can be measured in cups, ounces, or number of items. How many calories were in each food and drink. The total number of calories in the food you ate. Keep your food log near you, such as in a pocket-sized notebook or on an app or website on your mobile phone. Some programs will calculate calories for you and show you how many calories you have left to meet your daily goal. What are some portion-control tips? Know how many calories are in a serving. This will help you know how many servings you can have of a certain   food. Use a measuring cup to measure serving sizes. You could also try weighing out portions on a kitchen scale. With time, you will be able to estimate serving sizes for some foods. Take time to put servings of different foods on your favorite plates or in your favorite bowls and cups so you know what a serving looks like. Try not to eat straight from a food's packaging, such as from a bag or box. Eating straight from the package makes it hard to see how much you are eating and can lead to overeating. Put the amount you would like to eat in a cup or on a plate to make sure you are eating the right portion. Use smaller plates, glasses, and bowls for smaller portions and to prevent  overeating. Try not to multitask. For example, avoid watching TV or using your computer while eating. If it is time to eat, sit down at a table and enjoy your food. This will help you recognize when you are full. It will also help you be more mindful of what and how much you are eating. What are tips for following this plan? Reading food labels Check the calorie count compared with the serving size. The serving size may be smaller than what you are used to eating. Check the source of the calories. Try to choose foods that are high in protein, fiber, and vitamins, and low in saturated fat, trans fat, and sodium. Shopping Read nutrition labels while you shop. This will help you make healthy decisions about which foods to buy. Pay attention to nutrition labels for low-fat or fat-free foods. These foods sometimes have the same number of calories or more calories than the full-fat versions. They also often have added sugar, starch, or salt to make up for flavor that was removed with the fat. Make a grocery list of lower-calorie foods and stick to it. Cooking Try to cook your favorite foods in a healthier way. For example, try baking instead of frying. Use low-fat dairy products. Meal planning Use more fruits and vegetables. One-half of your plate should be fruits and vegetables. Include lean proteins, such as chicken, turkey, and fish. Lifestyle Each week, aim to do one of the following: 150 minutes of moderate exercise, such as walking. 75 minutes of vigorous exercise, such as running. General information Know how many calories are in the foods you eat most often. This will help you calculate calorie counts faster. Find a way of tracking calories that works for you. Get creative. Try different apps or programs if writing down calories does not work for you. What foods should I eat?  Eat nutritious foods. It is better to have a nutritious, high-calorie food, such as an avocado, than a food with  few nutrients, such as a bag of potato chips. Use your calories on foods and drinks that will fill you up and will not leave you hungry soon after eating. Examples of foods that fill you up are nuts and nut butters, vegetables, lean proteins, and high-fiber foods such as whole grains. High-fiber foods are foods with more than 5 g of fiber per serving. Pay attention to calories in drinks. Low-calorie drinks include water and unsweetened drinks. The items listed above may not be a complete list of foods and beverages you can eat. Contact a dietitian for more information. What foods should I limit? Limit foods or drinks that are not good sources of vitamins, minerals, or protein or that are high in unhealthy fats. These   include: Candy. Other sweets. Sodas, specialty coffee drinks, alcohol, and juice. The items listed above may not be a complete list of foods and beverages you should avoid. Contact a dietitian for more information. How do I count calories when eating out? Pay attention to portions. Often, portions are much larger when eating out. Try these tips to keep portions smaller: Consider sharing a meal instead of getting your own. If you get your own meal, eat only half of it. Before you start eating, ask for a container and put half of your meal into it. When available, consider ordering smaller portions from the menu instead of full portions. Pay attention to your food and drink choices. Knowing the way food is cooked and what is included with the meal can help you eat fewer calories. If calories are listed on the menu, choose the lower-calorie options. Choose dishes that include vegetables, fruits, whole grains, low-fat dairy products, and lean proteins. Choose items that are boiled, broiled, grilled, or steamed. Avoid items that are buttered, battered, fried, or served with cream sauce. Items labeled as crispy are usually fried, unless stated otherwise. Choose water, low-fat milk,  unsweetened iced tea, or other drinks without added sugar. If you want an alcoholic beverage, choose a lower-calorie option, such as a glass of wine or light beer. Ask for dressings, sauces, and syrups on the side. These are usually high in calories, so you should limit the amount you eat. If you want a salad, choose a garden salad and ask for grilled meats. Avoid extra toppings such as bacon, cheese, or fried items. Ask for the dressing on the side, or ask for olive oil and vinegar or lemon to use as dressing. Estimate how many servings of a food you are given. Knowing serving sizes will help you be aware of how much food you are eating at restaurants. Where to find more information Centers for Disease Control and Prevention: www.cdc.gov [place]. Department of Agriculture: myplate.gov Summary Calorie counting means keeping track of how many calories you eat and drink each day. If you eat fewer calories than your body needs, you should lose weight. A healthy amount of weight to lose per week is usually 1-2 lb (0.5-0.9 kg). This usually means reducing your daily calorie intake by 500-750 calories. The number of calories in a food can be found on a Nutrition Facts label. If a food does not have a Nutrition Facts label, try to look up the calories online or ask your dietitian for help. Use smaller plates, glasses, and bowls for smaller portions and to prevent overeating. Use your calories on foods and drinks that will fill you up and not leave you hungry shortly after a meal. This information is not intended to replace advice given to you by your health care provider. Make sure you discuss any questions you have with your health care provider. Document Revised: 06/13/2019 Document Reviewed: 06/13/2019 Elsevier Patient Education  2023 Elsevier Inc.  

## 2021-10-07 ENCOUNTER — Ambulatory Visit: Payer: BC Managed Care – PPO | Admitting: Physician Assistant

## 2021-10-08 DIAGNOSIS — Z124 Encounter for screening for malignant neoplasm of cervix: Secondary | ICD-10-CM | POA: Diagnosis not present

## 2021-10-08 DIAGNOSIS — Z130 Encounter for screening for diseases of the blood and blood-forming organs and certain disorders involving the immune mechanism: Secondary | ICD-10-CM | POA: Diagnosis not present

## 2021-10-08 DIAGNOSIS — Z01419 Encounter for gynecological examination (general) (routine) without abnormal findings: Secondary | ICD-10-CM | POA: Diagnosis not present

## 2021-10-08 DIAGNOSIS — Z113 Encounter for screening for infections with a predominantly sexual mode of transmission: Secondary | ICD-10-CM | POA: Diagnosis not present

## 2021-10-08 DIAGNOSIS — Z1389 Encounter for screening for other disorder: Secondary | ICD-10-CM | POA: Diagnosis not present

## 2021-10-08 DIAGNOSIS — N898 Other specified noninflammatory disorders of vagina: Secondary | ICD-10-CM | POA: Diagnosis not present

## 2021-10-08 LAB — HM PAP SMEAR: HM Pap smear: NEGATIVE

## 2022-01-10 DIAGNOSIS — J3081 Allergic rhinitis due to animal (cat) (dog) hair and dander: Secondary | ICD-10-CM | POA: Diagnosis not present

## 2022-01-10 DIAGNOSIS — J301 Allergic rhinitis due to pollen: Secondary | ICD-10-CM | POA: Diagnosis not present

## 2022-01-10 DIAGNOSIS — J3089 Other allergic rhinitis: Secondary | ICD-10-CM | POA: Diagnosis not present

## 2022-01-10 DIAGNOSIS — H1045 Other chronic allergic conjunctivitis: Secondary | ICD-10-CM | POA: Diagnosis not present

## 2022-03-21 ENCOUNTER — Encounter: Payer: Self-pay | Admitting: Physician Assistant

## 2022-03-21 ENCOUNTER — Ambulatory Visit: Payer: BC Managed Care – PPO | Admitting: Physician Assistant

## 2022-03-21 VITALS — BP 117/79 | HR 73 | Resp 18 | Ht 65.0 in | Wt 219.0 lb

## 2022-03-21 DIAGNOSIS — M545 Low back pain, unspecified: Secondary | ICD-10-CM | POA: Diagnosis not present

## 2022-03-21 DIAGNOSIS — M7918 Myalgia, other site: Secondary | ICD-10-CM | POA: Diagnosis not present

## 2022-03-21 MED ORDER — METHYLPREDNISOLONE 4 MG PO TBPK: ORAL_TABLET | ORAL | 0 refills | Status: DC
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
## 2022-03-21 NOTE — Patient Instructions (Signed)
Piriformis Syndrome  Piriformis syndrome is a condition that can cause pain and numbness in your buttocks and down the back of your leg. Piriformis syndrome happens when the small muscle that connects the base of your spine to your hip (piriformis muscle) presses on the nerve that runs down the back of your leg (sciatic nerve). The piriformis muscle helps your hip rotate and helps to bring your leg back and out. It also helps shift your weight to keep you stable while you are walking. The sciatic nerve runs under or through the piriformis muscle. Damage to the piriformis muscle can cause spasms that put pressure on the nerve below. This causes pain and discomfort while sitting and moving. The pain may feel as if it begins in the buttock and spreads (radiates) down your hip and thigh. What are the causes? This condition is caused by pressure on the sciatic nerve from the piriformis muscle. The piriformis muscle can get irritated with overuse, especially if other hip muscles are weak and the piriformis muscle has to do extra work. Piriformis syndrome can also occur after an injury, like a fall onto your buttocks. What increases the risk? You are more likely to develop this condition if you: Are a woman. Sit for long periods of time. Are a cyclist. Have weak buttocks muscles (gluteal muscles). What are the signs or symptoms? Symptoms of this condition include: Pain, tingling, or numbness that starts in the buttock and runs down the back of your leg (sciatica). Pain in the groin or thigh area. Your symptoms may get worse: The longer you sit. When you walk, run, or climb stairs. When straining to have a bowel movement. How is this diagnosed? This condition is diagnosed based on your symptoms, medical history, and physical exam. During the exam, your health care provider may: Move your leg into different positions to check for pain. Press on the muscles of your hip and buttock to see if that  increases your symptoms. You may also have tests, including: Imaging tests such as X-rays, CT, MRI, or ultrasound. Electromyogram (EMG). This test measures electrical signals sent by your nerves into the muscles. Nerve conduction study. This test measures how well electrical signals pass through your nerves. How is this treated? This condition may be treated by: Stopping all activities that cause pain or make your condition worse. Applying ice or using heat therapy. Taking medicines to reduce pain and swelling. Taking a muscle relaxer (muscle relaxant) to stop muscle spasms. Doing range-of-motion and strengthening exercises (physical therapy) as told by your health care provider. Having massage, acupuncture, or local electrical stimulation (transcutaneous electrical nerve stimulation, TENS). Getting an injection of medicine in the piriformis muscle. Your health care provider will choose the medicine based on your condition. He or she may inject: An anti-inflammatory medicine (steroid) to reduce swelling. A numbing medicine (local anesthetic) to block the pain. Botulinum toxin. The toxin blocks nerve impulses to specific muscles to reduce muscle tension. In rare cases, you may need surgery to cut the muscle and release pressure on the nerve if other treatments do not work. Follow these instructions at home: Activity Do not sit for long periods. Get up and walk around every 20 minutes or as often as told by your health care provider. When driving long distances, make sure to take frequent stops to get up and stretch. Use a cushion when you sit on hard surfaces. Do exercises as told by your health care provider. Return to your normal activities as   told by your health care provider. Ask your health care provider what activities are safe for you. Managing pain, stiffness, and swelling     If directed, apply heat to the area as often as told by your health care provider. Use the heat source  that your health care provider recommends, such as a moist heat pack or a heating pad. Place a towel between your skin and the heat source. Leave the heat on for 20-30 minutes. Remove the heat if your skin turns bright red. This is especially important if you are unable to feel pain, heat, or cold. You have a greater risk of getting burned. If directed, put ice on the injured area. To do this: Put ice in a plastic bag. Place a towel between your skin and the bag. Leave the ice on for 20 minutes, 2-3 times a day. Remove the ice if your skin turns bright red. This is very important. If you cannot feel pain, heat, or cold, you have a greater risk of damage to the area. General instructions Take over-the-counter and prescription medicines only as told by your health care provider. Ask your health care provider if the medicine prescribed to you requires you to avoid driving or using machinery. You may need to take these actions to prevent or treat constipation: Drink enough fluid to keep your urine pale yellow. Take over-the-counter or prescription medicines. Eat foods that are high in fiber, such as beans, whole grains, and fresh fruits and vegetables. Limit foods that are high in fat and processed sugars, such as fried or sweet foods. Keep all follow-up visits. This is important. How is this prevented? Do not sit for longer than 20 minutes at a time. When you sit, choose padded surfaces. Warm up and stretch before being active. Cool down and stretch after being active. Contact a health care provider if: Your pain and stiffness continue or get worse. Your leg or hip becomes weak. You have changes in your bowel function or bladder function. Summary Piriformis syndrome is a condition that can cause pain, tingling, and numbness in your buttocks and down the back of your leg. You may try applying heat or ice to relieve the pain. Do not sit for long periods. Get up and walk around every 20  minutes or as often as told by your health care provider. This information is not intended to replace advice given to you by your health care provider. Make sure you discuss any questions you have with your health care provider. Document Revised: 10/26/2020 Document Reviewed: 10/26/2020 Elsevier Patient Education  2023 Elsevier Inc.  

## 2022-08-31 ENCOUNTER — Encounter: Payer: Self-pay | Admitting: Family Medicine

## 2022-08-31 ENCOUNTER — Ambulatory Visit: Payer: BC Managed Care – PPO | Admitting: Family Medicine

## 2022-08-31 VITALS — BP 129/84 | HR 87 | Temp 98.2°F | Ht 65.0 in | Wt 221.0 lb

## 2022-08-31 DIAGNOSIS — J309 Allergic rhinitis, unspecified: Secondary | ICD-10-CM | POA: Diagnosis not present

## 2022-08-31 MED ORDER — SALINE SPRAY 0.65 % NA SOLN: 1.0000 | NASAL | 2 refills | Status: AC | PRN

## 2022-08-31 MED ORDER — FLUTICASONE PROPIONATE 50 MCG/ACT NA SUSP: 2.0000 | Freq: Every day | NASAL | 6 refills | Status: AC

## 2022-08-31 NOTE — Assessment & Plan Note (Signed)
Worse the past two days.  -flonase -continue antihistamine - tylneol for headache - nasal saline spray prn - if not better in one week can prescribe abx.

## 2022-08-31 NOTE — Patient Instructions (Signed)
It was nice to meet you,   Please use flonase once a day every day for the next two weeks.  Spray 2 sprays in each nostril.    You can use the nasal saline spray as needed.    Use tylenol  every 8 hours as needed for headache.    If symptoms dont' get better one week from today call us and I can prescribe an antibiotic.    Dr. Constance Goltz

## 2022-08-31 NOTE — Progress Notes (Signed)
   Acute Office Visit  Subjective:     Patient ID: Kelsey Ayers, female    DOB: 30-Jul-1995, 27 y.o.   MRN: [id number]  Chief Complaint  Patient presents with   Sinus Problem    Pt has been complaining of 2 days of headache 'behind her eyes' . She has allergies and takes allegra for this.  She also takes oxymetazoline nasal spray as needed.  She  did not take anything for the headache.  She jsut started taking sudafed.  Her ears feel muffled.  She feels like she has nasal congestoin and drainage.  Has had some greenish sputum as well but thinks most of this is drainage from her nose.  No itching or redness of the eyes. No fevers       ROS      Objective:    BP 129/84   Pulse 87   Temp 98.2 F (36.8 C) (Oral)   Ht  (1.651 m)   Wt 221 lb (100.2 kg)   LMP 08/30/2022   SpO2 97%   BMI 36.78 kg/m    Physical Exam Gen: alert, oriented Heent: no erythema, or effusion seen in the TM. Normal oropharynx. Minimal tenderness to palpation ove rthe frontal sinuses.    No results found for any visits on 08/31/22.      Assessment & Plan:   Problem List Items Addressed This Visit       Respiratory   Allergic sinusitis - Primary    Worse the past two days.  -flonase -continue antihistamine - tylneol for headache - nasal saline spray prn - if not better in one week can prescribe abx.       Relevant Medications   fluticasone (FLONASE) 50 MCG/ACT nasal spray   sodium chloride (OCEAN) 0.65 % SOLN nasal spray    Meds ordered this encounter  Medications   fluticasone (FLONASE) 50 MCG/ACT nasal spray    Sig: Place 2 sprays into both nostrils daily.    Dispense:  16 g    Refill:  6   sodium chloride (OCEAN) 0.65 % SOLN nasal spray    Sig: Place 1 spray into both nostrils as needed for congestion.    Dispense:  30 mL    Refill:  2    Return in about 4 weeks (around 09/28/2022), or if symptoms worsen or fail to improve.  Sandre Kitty, MD

## 2022-09-02 ENCOUNTER — Other Ambulatory Visit: Payer: Self-pay | Admitting: Family Medicine

## 2022-09-02 ENCOUNTER — Encounter: Payer: Self-pay | Admitting: Family Medicine

## 2022-09-02 MED ORDER — CEFDINIR 300 MG PO CAPS: 300.0000 mg | ORAL_CAPSULE | Freq: Two times a day (BID) | ORAL | 0 refills | Status: AC

## 2022-09-23 DIAGNOSIS — R21 Rash and other nonspecific skin eruption: Secondary | ICD-10-CM | POA: Diagnosis not present

## 2022-10-11 DIAGNOSIS — Z130 Encounter for screening for diseases of the blood and blood-forming organs and certain disorders involving the immune mechanism: Secondary | ICD-10-CM | POA: Diagnosis not present

## 2022-10-11 DIAGNOSIS — Z202 Contact with and (suspected) exposure to infections with a predominantly sexual mode of transmission: Secondary | ICD-10-CM | POA: Diagnosis not present

## 2022-10-11 DIAGNOSIS — Z01419 Encounter for gynecological examination (general) (routine) without abnormal findings: Secondary | ICD-10-CM | POA: Diagnosis not present

## 2022-10-12 ENCOUNTER — Encounter: Payer: Self-pay | Admitting: Family Medicine

## 2022-12-13 DIAGNOSIS — L739 Follicular disorder, unspecified: Secondary | ICD-10-CM | POA: Diagnosis not present

## 2023-02-14 DIAGNOSIS — M25572 Pain in left ankle and joints of left foot: Secondary | ICD-10-CM | POA: Diagnosis not present

## 2023-04-17 ENCOUNTER — Encounter: Payer: Self-pay | Admitting: Family Medicine

## 2023-10-23 ENCOUNTER — Encounter: Payer: Self-pay | Admitting: Family Medicine

## 2023-10-27 ENCOUNTER — Other Ambulatory Visit: Payer: Self-pay | Admitting: Family Medicine

## 2023-10-27 MED ORDER — FLUOXETINE HCL 40 MG PO CAPS: 40.0000 mg | ORAL_CAPSULE | Freq: Every day | ORAL | 0 refills | Status: AC

## 2023-10-27 NOTE — Telephone Encounter (Signed)
 Discussed with pt on the phone and sending in fluoxetine  40mg 

## 2024-04-25 ENCOUNTER — Other Ambulatory Visit (HOSPITAL_COMMUNITY): Payer: Self-pay | Admitting: Gastroenterology

## 2024-04-25 DIAGNOSIS — R1011 Right upper quadrant pain: Secondary | ICD-10-CM

## 2024-05-14 ENCOUNTER — Encounter (HOSPITAL_COMMUNITY): Payer: Self-pay

## 2024-05-14 ENCOUNTER — Encounter (HOSPITAL_COMMUNITY)
